# Patient Record
Sex: Female | Born: 1980 | Race: White | Hispanic: No | Marital: Single | Smoking: Current every day smoker
Health system: Southern US, Community
[De-identification: ages and names within clinical notes are randomized; demographics above are authoritative.]

## PROBLEM LIST (undated history)

## (undated) DIAGNOSIS — K529 Noninfective gastroenteritis and colitis, unspecified: Secondary | ICD-10-CM

## (undated) DIAGNOSIS — K5792 Diverticulitis of intestine, part unspecified, without perforation or abscess without bleeding: Secondary | ICD-10-CM

## (undated) DIAGNOSIS — U071 COVID-19: Secondary | ICD-10-CM

## (undated) DIAGNOSIS — T7840XA Allergy, unspecified, initial encounter: Secondary | ICD-10-CM

## (undated) DIAGNOSIS — F32A Depression, unspecified: Secondary | ICD-10-CM

## (undated) DIAGNOSIS — F329 Major depressive disorder, single episode, unspecified: Secondary | ICD-10-CM

## (undated) DIAGNOSIS — F419 Anxiety disorder, unspecified: Secondary | ICD-10-CM

## (undated) DIAGNOSIS — F17200 Nicotine dependence, unspecified, uncomplicated: Secondary | ICD-10-CM

## (undated) HISTORY — DX: Allergy, unspecified, initial encounter: T78.40XA

## (undated) HISTORY — PX: TONSILLECTOMY: SUR1361

## (undated) HISTORY — DX: Nicotine dependence, unspecified, uncomplicated: F17.200

## (undated) HISTORY — DX: Noninfective gastroenteritis and colitis, unspecified: K52.9

## (undated) HISTORY — DX: Diverticulitis of intestine, part unspecified, without perforation or abscess without bleeding: K57.92

## (undated) HISTORY — DX: Anxiety disorder, unspecified: F41.9

---

## 2012-09-16 ENCOUNTER — Emergency Department (INDEPENDENT_AMBULATORY_CARE_PROVIDER_SITE_OTHER)
Admission: EM | Admit: 2012-09-16 | Discharge: 2012-09-16 | Disposition: A | Discharge status: Home / Self Care | Payer: Self-pay | Source: Home / Self Care | Attending: Family Medicine | Admitting: Family Medicine

## 2012-09-16 ENCOUNTER — Encounter: Payer: Self-pay | Admitting: *Deleted

## 2012-09-16 DIAGNOSIS — H6982 Other specified disorders of Eustachian tube, left ear: Secondary | ICD-10-CM

## 2012-09-16 DIAGNOSIS — H6980 Other specified disorders of Eustachian tube, unspecified ear: Secondary | ICD-10-CM

## 2012-09-16 DIAGNOSIS — J069 Acute upper respiratory infection, unspecified: Secondary | ICD-10-CM

## 2012-09-16 DIAGNOSIS — H6990 Unspecified Eustachian tube disorder, unspecified ear: Secondary | ICD-10-CM

## 2012-09-16 MED ORDER — PREDNISONE 20 MG PO TABS: 20.0000 mg | ORAL_TABLET | Freq: Two times a day (BID) | ORAL | Status: DC

## 2012-09-16 MED ORDER — AZITHROMYCIN 250 MG PO TABS: ORAL_TABLET | ORAL | Status: DC

## 2012-09-16 MED ORDER — BENZONATATE 200 MG PO CAPS: 200.0000 mg | ORAL_CAPSULE | Freq: Every day | ORAL | Status: DC

## 2012-09-16 NOTE — ED Provider Notes (Signed)
History    CSN: [id number] Arrival date & time 09/16/12  1633  First MD Initiated Contact with Patient 09/16/12 1648     Chief Complaint  Patient presents with  . Otalgia  . Sinus Problem      HPI Comments: Patient complains of 5 day history of mild sore throat, body aches, sinus congestion, and bilateral earache (ears feel clogged).  She has a productive cough and sensation of tightness in her anterior chest.  No fevers, chills, and sweats.  She continues to smoke.  The history is provided by the patient.   History reviewed. No pertinent past medical history. Past Surgical History  Procedure Laterality Date  . Tonsillectomy     Family History  Problem Relation Age of Onset  . Hypertension Mother   . Diabetes Mother    History  Substance Use Topics  . Smoking status: Current Every Day Smoker -- 15 years    Types: Cigarettes  . Smokeless tobacco: Never Used  . Alcohol Use: No   OB History   Grav Para Term Preterm Abortions TAB SAB Ect Mult Living                 Review of Systems + sore throat + cough No pleuritic pain No wheezing + nasal congestion ? post-nasal drainage + sinus pain/pressure No itchy/red eyes + earache No hemoptysis No SOB No fever, + chills No nausea No vomiting No abdominal pain No diarrhea No urinary symptoms No skin rashes + fatigue + myalgias + headache Used OTC meds without relief  Allergies  Review of patient's allergies indicates no known allergies.  Home Medications   Current Outpatient Rx  Name  Route  Sig  Dispense  Refill  . azithromycin (ZITHROMAX Z-PAK) 250 MG tablet      Take 2 tabs today; then begin one tab once daily for 4 more days. (Rx void after 09/24/12)   6 each   0   . benzonatate (TESSALON) 200 MG capsule   Oral   Take 1 capsule (200 mg total) by mouth at bedtime.   12 capsule   0   . predniSONE (DELTASONE) 20 MG tablet   Oral   Take 1 tablet (20 mg total) by mouth 2 (two) times daily. Take with  food.   10 tablet   0    BP 120/65  Pulse 69  Temp(Src) 98.4 F (36.9 C) (Oral)  Resp 14  Ht 5\' 3"  (1.6 m)  Wt 150 lb (68.04 kg)  BMI 26.58 kg/m2  SpO2 98% Physical Exam Nursing notes and Vital Signs reviewed. Appearance:  Patient appears healthy, stated age, and in no acute distress Eyes:  Pupils are equal, round, and reactive to light and accomodation.  Extraocular movement is intact.  Conjunctivae are not inflamed  Ears:  Canals normal.  Tympanic membranes normal.  Nose:  Mildly congested turbinates.  No sinus tenderness.  Pharynx:  Normal Neck:  Supple.   Tender shotty posterior nodes are palpated bilaterally  Lungs:  Clear to auscultation.  Breath sounds are equal.  Chest:  Distinct tenderness to palpation over the mid-sternum.  Heart:  Regular rate and rhythm without murmurs, rubs, or gallops.  Abdomen:  Nontender without masses or hepatosplenomegaly.  Bowel sounds are present.  No CVA or flank tenderness.  Extremities:  No edema.  No calf tenderness Skin:  No rash present.   ED Course  Procedures  none Labs Reviewed - Tympanogram positive peak pressure left ear; normal right  ear  1. Acute upper respiratory infections of unspecified site; suspect viral URI   2. Eustachian tube dysfunction, left     MDM  There is no evidence of bacterial infection today.   Treat symptomatically for now  Prednisone burst.  Prescription written for Benzonatate (Tessalon) to take at bedtime for night-time cough.  Take Mucinex D (guaifenesin with decongestant) twice daily for congestion.  Increase fluid intake, rest. May use Afrin nasal spray (or generic oxymetazoline) twice daily for about 5 days.  Also recommend using saline nasal spray several times daily and saline nasal irrigation (AYR is a common brand) Stop all antihistamines for now, and other non-prescription cough/cold preparations. May take Ibuprofen 200mg , 4 tabs every 8 hours with food for back and chest/sternum  discomfort. Begin Azithromycin if not improving about one week or if persistent fever develops (Given a prescription to hold, with an expiration date)  Follow-up with family doctor if not improving about10 days.   Lattie Haw, MD 09/16/12 [phone]

## 2012-09-16 NOTE — ED Notes (Signed)
Patient c/o bilateral ear pain, sinus/facial/orbital pain with body aches at times. She was treated for a sinus infection 2 months ago but feels it never fully resolved. Taking IBF daily

## 2012-09-25 ENCOUNTER — Emergency Department
Admission: EM | Admit: 2012-09-25 | Discharge: 2012-09-25 | Disposition: A | Discharge status: Home / Self Care | Payer: Self-pay | Source: Home / Self Care

## 2012-09-25 ENCOUNTER — Encounter: Payer: Self-pay | Admitting: *Deleted

## 2012-09-25 DIAGNOSIS — S335XXA Sprain of ligaments of lumbar spine, initial encounter: Secondary | ICD-10-CM

## 2012-09-25 DIAGNOSIS — S39012A Strain of muscle, fascia and tendon of lower back, initial encounter: Secondary | ICD-10-CM

## 2012-09-25 LAB — POCT URINALYSIS DIP (MANUAL ENTRY)
Bilirubin, UA: NEGATIVE
Glucose, UA: NEGATIVE
Ketones, POC UA: NEGATIVE
Leukocytes, UA: NEGATIVE
Nitrite, UA: NEGATIVE
pH, UA: 5.5 (ref 5–8)

## 2012-09-25 MED ORDER — CEPHALEXIN 500 MG PO CAPS: 500.0000 mg | ORAL_CAPSULE | Freq: Three times a day (TID) | ORAL | Status: DC

## 2012-09-25 MED ORDER — MELOXICAM 15 MG PO TABS: 15.0000 mg | ORAL_TABLET | Freq: Every day | ORAL | Status: DC

## 2012-09-25 MED ORDER — CYCLOBENZAPRINE HCL 10 MG PO TABS: 10.0000 mg | ORAL_TABLET | Freq: Three times a day (TID) | ORAL | Status: DC | PRN

## 2012-09-25 NOTE — ED Notes (Signed)
Joanne Brown c/o flank pain x 2 weeks and pelvic tenderness x 2 days. Used ice/heat, IBF and Goody's without relief.  Denies hematuria.

## 2012-09-25 NOTE — ED Provider Notes (Signed)
History    CSN: [id number] Arrival date & time 09/25/12  1259  None    Chief Complaint  Patient presents with  . Flank Pain    HPI  Patient presented today with chief complaint back pain. Patient states that back pains are present for at least last month. 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 Has noticed bilateral low back pain over 2-4 weeks. Back pain is without radiation into the legs. Pain does seem to be worsened with for flexion as well as prolonged sitting. Patient release her symptoms are related to urinary tract infection although she denies any dysuria, increased urinary frequency, nausea, fever. No bowel or bladder anesthesia. History reviewed. No pertinent past medical history. Past Surgical History  Procedure Laterality Date  . Tonsillectomy     Family History  Problem Relation Age of Onset  . Hypertension Mother   . Diabetes Mother    History  Substance Use Topics  . Smoking status: Current Every Day Smoker -- 15 years    Types: Cigarettes  . Smokeless tobacco: Never Used  . Alcohol Use: No   OB History   Grav Para Term Preterm Abortions TAB SAB Ect Mult Living                 Review of Systems  All other systems reviewed and are negative.    Allergies  Review of patient's allergies indicates no known allergies.  Home Medications   Current Outpatient Rx  Name  Route  Sig  Dispense  Refill  . cephALEXin (KEFLEX) 500 MG capsule   Oral   Take 1 capsule (500 mg total) by mouth 3 (three) times daily.   21 capsule   0   . cyclobenzaprine (FLEXERIL) 10 MG tablet   Oral   Take 1 tablet (10 mg total) by mouth 3 (three) times daily as needed for muscle spasms.   30 tablet   0   . meloxicam (MOBIC) 15 MG tablet   Oral   Take 1 tablet (15 mg total) by mouth daily.   30 tablet   1    BP 119/72  Pulse 78  Temp(Src) 98.1 F (36.7 C) (Oral)  Resp 14  Wt 150 lb (68.04 kg)  BMI 26.58 kg/m2  SpO2 100% Physical Exam    Constitutional: She appears well-developed and well-nourished.  HENT:  Head: Normocephalic and atraumatic.  Eyes: Conjunctivae are normal. Pupils are equal, round, and reactive to light.  Neck: Normal range of motion.  Cardiovascular: Normal rate, regular rhythm and normal heart sounds.   Pulmonary/Chest: Effort normal.  Abdominal: Soft. Bowel sounds are normal.  Positive mild suprapubic tenderness.  Musculoskeletal: Normal range of motion.       Arms: Positive has palpation lumbar spine diffusely. No flank pain or CVA tenderness.  Neurological: She is alert.  Skin: Skin is warm.    ED Course  Procedures (including critical care time) Labs Reviewed  POCT URINALYSIS DIP (MANUAL ENTRY)   No results found. 1. Lumbar strain, initial encounter     MDM  Overall symptoms seem most consistent with a lumbar strain. However, will treat patient for UTI concomitantly has patient does have some suprapubic tenderness. Noted trace blood on urinalysis. Did discuss imaging to rule out kidney stone. Patient declined. Will treat with Mobic, Flexeril, Keflex. Urine culture. Discussed with patient her symptoms fail to improve despite treatment and imaging will likely be needed. Patient expressed understanding of this.     The  patient and/or caregiver has been counseled thoroughly with regard to treatment plan and/or medications prescribed including dosage, schedule, interactions, rationale for use, and possible side effects and they verbalize understanding. Diagnoses and expected course of recovery discussed and will return if not improved as expected or if the condition worsens. Patient and/or caregiver verbalized understanding.       Doree Albee, MD 09/25/12 1349

## 2012-09-29 ENCOUNTER — Telehealth: Payer: Self-pay | Admitting: *Deleted

## 2012-10-02 ENCOUNTER — Telehealth: Payer: Self-pay | Admitting: *Deleted

## 2012-10-02 NOTE — ED Notes (Signed)
Pt called reports that she still has urinary urgency, with some back pain, and cloudy urine. She reports that her abd pain is some better. She would also like a refill on the hydrocodone she received at the ED. Per Dr Cathren Harsh she should take the keflex 500 mg she received from dr newton with new directions of i PO BID x 1 wk, AZO for symptomatic relief, and if she is no better return to clinic or ED for further evaluation. Refill of Hydrocodone denied. Pt notified.

## 2012-10-10 ENCOUNTER — Emergency Department (HOSPITAL_BASED_OUTPATIENT_CLINIC_OR_DEPARTMENT_OTHER): Payer: Medicaid Other

## 2012-10-10 ENCOUNTER — Encounter (HOSPITAL_BASED_OUTPATIENT_CLINIC_OR_DEPARTMENT_OTHER): Payer: Self-pay | Admitting: *Deleted

## 2012-10-10 ENCOUNTER — Emergency Department (HOSPITAL_BASED_OUTPATIENT_CLINIC_OR_DEPARTMENT_OTHER)
Admission: EM | Admit: 2012-10-10 | Discharge: 2012-10-10 | Disposition: A | Discharge status: Home / Self Care | Payer: Medicaid Other | Attending: Emergency Medicine | Admitting: Emergency Medicine

## 2012-10-10 DIAGNOSIS — S335XXA Sprain of ligaments of lumbar spine, initial encounter: Secondary | ICD-10-CM | POA: Insufficient documentation

## 2012-10-10 DIAGNOSIS — F329 Major depressive disorder, single episode, unspecified: Secondary | ICD-10-CM | POA: Insufficient documentation

## 2012-10-10 DIAGNOSIS — S39012A Strain of muscle, fascia and tendon of lower back, initial encounter: Secondary | ICD-10-CM

## 2012-10-10 DIAGNOSIS — X58XXXA Exposure to other specified factors, initial encounter: Secondary | ICD-10-CM | POA: Insufficient documentation

## 2012-10-10 DIAGNOSIS — Y939 Activity, unspecified: Secondary | ICD-10-CM | POA: Insufficient documentation

## 2012-10-10 DIAGNOSIS — F17200 Nicotine dependence, unspecified, uncomplicated: Secondary | ICD-10-CM | POA: Insufficient documentation

## 2012-10-10 DIAGNOSIS — Y929 Unspecified place or not applicable: Secondary | ICD-10-CM | POA: Insufficient documentation

## 2012-10-10 DIAGNOSIS — F3289 Other specified depressive episodes: Secondary | ICD-10-CM | POA: Insufficient documentation

## 2012-10-10 HISTORY — DX: Depression, unspecified: F32.A

## 2012-10-10 HISTORY — DX: Major depressive disorder, single episode, unspecified: F32.9

## 2012-10-10 MED ORDER — HYDROCODONE-ACETAMINOPHEN 5-325 MG PO TABS: 2.0000 | ORAL_TABLET | ORAL | Status: DC | PRN

## 2012-10-10 NOTE — ED Notes (Signed)
States she has had extensive work up with urologist and he states her pain is not urology related and he does not need to see her any longer.

## 2012-10-10 NOTE — ED Provider Notes (Signed)
History    CSN: [id number] Arrival date & time 10/10/12  1153  First MD Initiated Contact with Patient 10/10/12 1201     Chief Complaint  Patient presents with  . Back Pain   (Consider location/radiation/quality/duration/timing/severity/associated sxs/prior Treatment) Patient is a 32 y.o. female presenting with back pain. The history is provided by the patient. No language interpreter was used.  Back Pain Location:  Thoracic spine and lumbar spine Quality:  Aching Radiates to:  Does not radiate Pain severity:  Moderate Pain is:  Worse during the day Onset quality:  Sudden Duration:  4 weeks Timing:  Constant Progression:  Worsening Chronicity:  New Relieved by:  Nothing Ineffective treatments:  None tried Pt has had evaluation for back pain including ua, ct urology consult.   Pt had  Past Medical History  Diagnosis Date  . Depression    Past Surgical History  Procedure Laterality Date  . Tonsillectomy     Family History  Problem Relation Age of Onset  . Hypertension Mother   . Diabetes Mother    History  Substance Use Topics  . Smoking status: Current Every Day Smoker -- 15 years    Types: Cigarettes  . Smokeless tobacco: Never Used  . Alcohol Use: No   OB History   Grav Para Term Preterm Abortions TAB SAB Ect Mult Living                 Review of Systems  Musculoskeletal: Positive for back pain.  All other systems reviewed and are negative.    Allergies  Review of patient's allergies indicates no known allergies.  Home Medications   Current Outpatient Rx  Name  Route  Sig  Dispense  Refill  . Citalopram Hydrobromide (CELEXA PO)   Oral   Take by mouth.         . Oxycodone-Acetaminophen (PERCOCET PO)   Oral   Take by mouth.         . cephALEXin (KEFLEX) 500 MG capsule   Oral   Take 1 capsule (500 mg total) by mouth 3 (three) times daily.   21 capsule   0   . cyclobenzaprine (FLEXERIL) 10 MG tablet   Oral   Take 1 tablet (10 mg  total) by mouth 3 (three) times daily as needed for muscle spasms.   30 tablet   0   . meloxicam (MOBIC) 15 MG tablet   Oral   Take 1 tablet (15 mg total) by mouth daily.   30 tablet   1    BP 110/79  Pulse 97  Temp(Src) 98.7 F (37.1 C) (Oral)  Resp 18  Wt 150 lb (68.04 kg)  BMI 26.58 kg/m2  SpO2 100% Physical Exam  Nursing note and vitals reviewed. Constitutional: She is oriented to person, place, and time. She appears well-developed.  HENT:  Head: Normocephalic.  Right Ear: External ear normal.  Left Ear: External ear normal.  Nose: Nose normal.  Mouth/Throat: Oropharynx is clear and moist.  Eyes: Pupils are equal, round, and reactive to light.  Neck: Normal range of motion. Neck supple.  Cardiovascular: Normal rate.   Pulmonary/Chest: Effort normal and breath sounds normal.  Abdominal: Soft.  Musculoskeletal: Normal range of motion.  Neurological: She is alert and oriented to person, place, and time. She has normal reflexes.  Skin: Skin is warm.  Psychiatric: She has a normal mood and affect.    ED Course  Procedures (including critical care time) Labs Reviewed - No data  to display Dg Lumbar Spine Complete  10/10/2012   *RADIOLOGY REPORT*  Clinical Data: Back pain.  No injury.  LUMBAR SPINE - COMPLETE 4+ VIEW  Comparison:  None.  Findings:  There is no evidence of lumbar spine fracture. Alignment is normal.  Intervertebral disc spaces are maintained. Incidental spina bifida occulta.  IMPRESSION: Negative.   Original Report Authenticated By: Davonna Belling, M.D.   US Transvaginal Non-ob  10/10/2012   *RADIOLOGY REPORT*  Clinical Data: Low back and pelvic pain.  Possible ovarian cyst on recent CT.  No menses since beginning birth control pills 3 months prior to this exam  TRANSABDOMINAL AND TRANSVAGINAL ULTRASOUND OF PELVIS Technique:  Both transabdominal and transvaginal ultrasound examinations of the pelvis were performed. Transabdominal technique was performed for  global imaging of the pelvis including uterus, ovaries, adnexal regions, and pelvic cul-de-sac.  It was necessary to proceed with endovaginal exam following the transabdominal exam to visualize the myometrium, endometrium and adnexa.  Comparison:  CT 10/07/2012  Findings:  Uterus: Is anteverted and anteflexed and demonstrates a sagittal length of 6.6 cm, depth of 3.1 cm and width of 4.5 cm.  A homogeneous myometrium is seen  Endometrium: Appears thin and echogenic with a width of 2.5 mm.  No areas of focal thickening or heterogeneity are noted  Right ovary:  Has a normal appearance measuring 2.6 x 1.1 x 1.8 cm  Left ovary: Has a normal appearance measuring 4.0 x 2.9 x 2.9 cm and contains a collapsing dominant follicle  Other findings: No pelvic fluid or separate adnexal masses are seen.  IMPRESSION: Unremarkable pelvic ultrasound.   Original Report Authenticated By: Rhodia Albright, M.D.   US Pelvis Complete  10/10/2012   *RADIOLOGY REPORT*  Clinical Data: Low back and pelvic pain.  Possible ovarian cyst on recent CT.  No menses since beginning birth control pills 3 months prior to this exam  TRANSABDOMINAL AND TRANSVAGINAL ULTRASOUND OF PELVIS Technique:  Both transabdominal and transvaginal ultrasound examinations of the pelvis were performed. Transabdominal technique was performed for global imaging of the pelvis including uterus, ovaries, adnexal regions, and pelvic cul-de-sac.  It was necessary to proceed with endovaginal exam following the transabdominal exam to visualize the myometrium, endometrium and adnexa.  Comparison:  CT 10/07/2012  Findings:  Uterus: Is anteverted and anteflexed and demonstrates a sagittal length of 6.6 cm, depth of 3.1 cm and width of 4.5 cm.  A homogeneous myometrium is seen  Endometrium: Appears thin and echogenic with a width of 2.5 mm.  No areas of focal thickening or heterogeneity are noted  Right ovary:  Has a normal appearance measuring 2.6 x 1.1 x 1.8 cm  Left ovary: Has a  normal appearance measuring 4.0 x 2.9 x 2.9 cm and contains a collapsing dominant follicle  Other findings: No pelvic fluid or separate adnexal masses are seen.  IMPRESSION: Unremarkable pelvic ultrasound.   Original Report Authenticated By: Rhodia Albright, M.D.   1. Lumbar strain, initial encounter     MDM  Pt counseled on results.    I will treat her pain.   I advised her to see the Orthopaedist for evaluation.   Ovarys are normal.  No cyst.  Elson Areas, PA-C 10/10/12 1507

## 2012-10-10 NOTE — ED Notes (Signed)
Lower back pain and lower abdominal pain for several weeks. States she has had multiple visits to UC, urologist and [facility] and they cannot make the go away. "CT showed ulcers on her ovaries that look normal but could cause pain" per patient. She is getting no pain relief with Percocet. Drove herself here.

## 2012-10-10 NOTE — ED Provider Notes (Signed)
Medical screening examination/treatment/procedure(s) were performed by non-physician practitioner and as supervising physician I was immediately available for consultation/collaboration.   Gilda Crease, MD 10/10/12 [phone]

## 2012-10-13 NOTE — ED Notes (Signed)
Pt presented to ED lobby today requesting to either have an MRI or additional pain medication. Pt sts she does not want to check in. Pt advised that no medication can be prescribed without a physical exam by our ED provider. Pt sts "you guys would only give me enough pain medication to last 2 days anyway". Pt sts she has appt with ortho on July 31.

## 2013-11-02 ENCOUNTER — Telehealth: Payer: Self-pay

## 2013-11-02 NOTE — Telephone Encounter (Signed)
[facility] 17 pages to GI Historical Provider

## 2014-01-14 ENCOUNTER — Ambulatory Visit (INDEPENDENT_AMBULATORY_CARE_PROVIDER_SITE_OTHER): Payer: BC Managed Care – PPO | Admitting: Physician Assistant

## 2014-01-14 VITALS — BP 122/84 | HR 81 | Temp 98.7°F | Resp 16 | Ht 67.0 in | Wt 166.4 lb

## 2014-01-14 DIAGNOSIS — F17200 Nicotine dependence, unspecified, uncomplicated: Secondary | ICD-10-CM

## 2014-01-14 DIAGNOSIS — L02212 Cutaneous abscess of back [any part, except buttock]: Secondary | ICD-10-CM

## 2014-01-14 DIAGNOSIS — T7840XA Allergy, unspecified, initial encounter: Secondary | ICD-10-CM

## 2014-01-14 DIAGNOSIS — F32A Depression, unspecified: Secondary | ICD-10-CM

## 2014-01-14 DIAGNOSIS — F419 Anxiety disorder, unspecified: Secondary | ICD-10-CM | POA: Insufficient documentation

## 2014-01-14 DIAGNOSIS — K5792 Diverticulitis of intestine, part unspecified, without perforation or abscess without bleeding: Secondary | ICD-10-CM | POA: Insufficient documentation

## 2014-01-14 DIAGNOSIS — F329 Major depressive disorder, single episode, unspecified: Secondary | ICD-10-CM

## 2014-01-14 DIAGNOSIS — Z889 Allergy status to unspecified drugs, medicaments and biological substances status: Secondary | ICD-10-CM

## 2014-01-14 MED ORDER — DOXYCYCLINE HYCLATE 100 MG PO CAPS: 100.0000 mg | ORAL_CAPSULE | Freq: Two times a day (BID) | ORAL | Status: AC

## 2014-01-14 NOTE — Progress Notes (Signed)
IDENTIFYING INFORMATION  Joanne Brown / DOB: October 22, 1980 / MRN: [id number]  The patient has Depression; Anxiety; Diverticulitis; Smoking addiction; and Allergy on her problem list.  SUBJECTIVE  Chief Complaint: Otalgia and knot on back   History of present illness: Joanne Brown is a 33 y.o. year old female who presents with a "knot" on her back.  This started 5 days ago, at which point she and her mother "messed" with it and it became aggravated and has grown larger since that time.  She reports some hardness of the lump as well as tenderness.  She denies constitutional symptoms at this time.   She reports that she started having ear pain 5 days ago.   She has tried some Amoxicillin that belonged to her daughter who had some left over from a previous prescription and says it has helped somewhat.  She denies a change in hearing. She reports a history of allergies and has been given fluticasone in the past.  She is amenable to resuming this medication at this time.       She  has a past medical history of Depression; Anxiety; Diverticulitis; Colitis; Smoking addiction; and Allergy..  The patient has a current medication list which includes the following prescription(s): sertraline, citalopram hydrobromide, cyclobenzaprine, hydrocodone-acetaminophen, meloxicam, and oxycodone-acetaminophen..  Joanne Brown has No Known Allergies.. She  reports that she has been smoking Cigarettes.  She has been smoking about 0.00 packs per day for the past 15 years. She has never used smokeless tobacco. She reports that she does not drink alcohol or use illicit drugs. and she  reports that she does not currently engage in sexual activity.  The patient  has past surgical history that includes Tonsillectomy and Tonsillectomy.Marland Kitchen.  Her family history includes Diabetes in her father; Heart disease in her brother and father.  Review of Systems  Constitutional: Negative.   HENT: Negative.   Respiratory:  Negative.   Cardiovascular: Negative.   Musculoskeletal: Negative for back pain and myalgias.  Skin:       Positive for bump on back.    OBJECTIVE  Blood pressure 122/84, pulse 81, temperature 98.7 F (37.1 C), temperature source Oral, resp. rate 16, height 5\' 7"  (1.702 m), weight 166 lb 6.4 oz (75.479 kg), SpO2 99.00%. The patient's body mass index is 26.06 kg/(m^2).  Physical Exam  Vitals reviewed. Constitutional: She is well-developed, well-nourished, and in no distress.  HENT:  Head: Normocephalic.  Right Ear: Hearing, tympanic membrane, external ear and ear canal normal.  Left Ear: Hearing, tympanic membrane, external ear and ear canal normal.  Nose: Nose normal.  Mouth/Throat: Uvula is midline, oropharynx is clear and moist and mucous membranes are normal. No oropharyngeal exudate, posterior oropharyngeal edema, posterior oropharyngeal erythema or tonsillar abscesses.  Eyes: Conjunctivae, EOM and lids are normal. Pupils are equal, round, and reactive to light.  Cardiovascular: Normal rate, regular rhythm and normal heart sounds.   Skin: Skin is warm, dry and intact. She is not diaphoretic.       No results found for this or any previous visit (from the past 24 hour(s)).  ASSESSMENT & PLAN  Abrar was seen today for otalgia and knot on back.  Diagnoses and associated orders for this visit:  Abscess of lower back - doxycycline (VIBRAMYCIN) 100 MG capsule; Take 1 capsule (100 mg total) by mouth 2 (two) times daily. I&D not indicated at this time given the lack of fluctuance. Patient advised to RTC for in ten days  if her symptoms do not abate.    H/O seasonal allergies complicated by otalgia -     Patient has agreed to resume her fluticasone intranasal and to use Afrin OTC for three days only.      The patient was instructed to to call or comeback to clinic as needed, or should symptoms warrant.  Deliah BostonMichael Clark, MHS, PA-C [facility] 01/14/2014 8:58 PM

## 2014-01-14 NOTE — Patient Instructions (Signed)
Apply warm compresses to the affected area frequently and take antibiotic as prescribed.    Abscess An abscess is an infected area that contains a collection of pus and debris.It can occur in almost any part of the body. An abscess is also known as a furuncle or boil. CAUSES  An abscess occurs when tissue gets infected. This can occur from blockage of oil or sweat glands, infection of hair follicles, or a minor injury to the skin. As the body tries to fight the infection, pus collects in the area and creates pressure under the skin. This pressure causes pain. People with weakened immune systems have difficulty fighting infections and get certain abscesses more often.  SYMPTOMS Usually an abscess develops on the skin and becomes a painful mass that is red, warm, and tender. If the abscess forms under the skin, you may feel a moveable soft area under the skin. Some abscesses break open (rupture) on their own, but most will continue to get worse without care. The infection can spread deeper into the body and eventually into the bloodstream, causing you to feel ill.  DIAGNOSIS  Your caregiver will take your medical history and perform a physical exam. A sample of fluid may also be taken from the abscess to determine what is causing your infection. TREATMENT  Your caregiver may prescribe antibiotic medicines to fight the infection. However, taking antibiotics alone usually does not cure an abscess. Your caregiver may need to make a small cut (incision) in the abscess to drain the pus. In some cases, gauze is packed into the abscess to reduce pain and to continue draining the area. HOME CARE INSTRUCTIONS   Only take over-the-counter or prescription medicines for pain, discomfort, or fever as directed by your caregiver.  If you were prescribed antibiotics, take them as directed. Finish them even if you start to feel better.  If gauze is used, follow your caregiver's directions for changing the  gauze.  To avoid spreading the infection:  Keep your draining abscess covered with a bandage.  Wash your hands well.  Do not share personal care items, towels, or whirlpools with others.  Avoid skin contact with others.  Keep your skin and clothes clean around the abscess.  Keep all follow-up appointments as directed by your caregiver. SEEK MEDICAL CARE IF:   You have increased pain, swelling, redness, fluid drainage, or bleeding.  You have muscle aches, chills, or a general ill feeling.  You have a fever. MAKE SURE YOU:   Understand these instructions.  Will watch your condition.  Will get help right away if you are not doing well or get worse. Document Released: 12/20/2004 Document Revised: 09/11/2011 Document Reviewed: 05/25/2011 [facility] Patient Information 2015 [employer] [place]. This information is not intended to replace advice given to you by your health care provider. Make sure you discuss any questions you have with your health care provider.

## 2014-01-15 NOTE — Progress Notes (Signed)
I was directly involved with the patient's care and agree with the physical, diagnosis and treatment plan.  

## 2014-02-01 ENCOUNTER — Telehealth: Payer: Self-pay

## 2014-02-01 ENCOUNTER — Ambulatory Visit (INDEPENDENT_AMBULATORY_CARE_PROVIDER_SITE_OTHER): Payer: BC Managed Care – PPO | Admitting: Emergency Medicine

## 2014-02-01 ENCOUNTER — Ambulatory Visit (HOSPITAL_COMMUNITY)
Admission: RE | Admit: 2014-02-01 | Discharge: 2014-02-01 | Disposition: A | Discharge status: Home / Self Care | Payer: BC Managed Care – PPO | Source: Ambulatory Visit | Attending: Emergency Medicine | Admitting: Emergency Medicine

## 2014-02-01 VITALS — BP 110/70 | HR 87 | Temp 99.1°F | Resp 16 | Ht 63.0 in | Wt 165.0 lb

## 2014-02-01 DIAGNOSIS — R1011 Right upper quadrant pain: Secondary | ICD-10-CM

## 2014-02-01 DIAGNOSIS — R1031 Right lower quadrant pain: Secondary | ICD-10-CM

## 2014-02-01 LAB — POCT CBC
Granulocyte percent: 78.7 %G (ref 37–80)
HCT, POC: 43.6 % (ref 37.7–47.9)
HEMOGLOBIN: 14.2 g/dL (ref 12.2–16.2)
Lymph, poc: 2.1 (ref 0.6–3.4)
MCH, POC: 29.2 pg (ref 27–31.2)
MCHC: 32.6 g/dL (ref 31.8–35.4)
MCV: 89.4 fL (ref 80–97)
MID (cbc): 1.1 — AB (ref 0–0.9)
MPV: 7.9 fL (ref 0–99.8)
PLATELET COUNT, POC: 337 10*3/uL (ref 142–424)
POC GRANULOCYTE: 11.9 — AB (ref 2–6.9)
POC LYMPH PERCENT: 14.2 %L (ref 10–50)
POC MID %: 7.1 % (ref 0–12)
RBC: 4.88 M/uL (ref 4.04–5.48)
RDW, POC: 13.5 %
WBC: 15.1 10*3/uL — AB (ref 4.6–10.2)

## 2014-02-01 LAB — POCT UA - MICROSCOPIC ONLY
BACTERIA, U MICROSCOPIC: NEGATIVE
CASTS, UR, LPF, POC: NEGATIVE
Crystals, Ur, HPF, POC: NEGATIVE
Mucus, UA: NEGATIVE
RBC, urine, microscopic: NEGATIVE
WBC, Ur, HPF, POC: NEGATIVE
Yeast, UA: NEGATIVE

## 2014-02-01 LAB — POCT URINALYSIS DIPSTICK
BILIRUBIN UA: NEGATIVE
GLUCOSE UA: NEGATIVE
Ketones, UA: NEGATIVE
LEUKOCYTES UA: NEGATIVE
NITRITE UA: NEGATIVE
PH UA: 6
Protein, UA: NEGATIVE
Spec Grav, UA: 1.01
UROBILINOGEN UA: 0.2

## 2014-02-01 NOTE — Patient Instructions (Signed)
[facility] then to admitting to register for CT scan

## 2014-02-01 NOTE — Telephone Encounter (Signed)
Pt was seen this morning and was sent for a CT scan. Her scan was negative and Dr. Dareen PianoAnderson instructed her to continue with plan per pt. Chelle called Dr. Dareen PianoAnderson to see what the plan was and he had not been advised of the neg CT. Per Dr. Dareen PianoAnderson, get a copy of the CT scan to see double check that it is completely negative.  Received CT scan-sent to be scanned-completely negative Per Dr. Dareen PianoAnderson, Follow-up with us or GI (preferrable GI) and take OTC Immodium for diarrhea. Pt has already been taking Immodium and is not having very much diarrhea anymore, just abd pain. Advised rest and fluids and if it gets worse to come here or to the ER

## 2014-02-01 NOTE — Progress Notes (Signed)
[facility] [address], [place] [id number] [phone]  Date:  02/01/2014   Name:  Joanne Brown   DOB:  10-24-1980   MRN:  [id number]  PCP:  No PCP Per Patient    Chief Complaint: Abdominal Pain and Diarrhea   History of Present Illness:  Joanne Brown is a 33 y.o. very pleasant female patient who presents with the following:  Patient gives a history of diverticulitis (right side) in April this year.  It was not treated surgically Later in summer she underwent a colonoscopy and was found to have mild colitis again not treated. Over the weekend developed watery diarrhea.  20 stools at lease.  The patient has no complaint of blood, mucous, or pus in her stools. She had no measured fever but felt hot.  No chills. No dysuria, urgency or frequency.  No GYN symptom No ill contacts Recently stopped her "stress medications" and is very teary Is concerned she has another abscess as she feels "bad".\ No improvement with over the counter medications or other home remedies.  Denies other complaint or health concern today.   Patient Active Problem List   Diagnosis Date Noted  . Depression   . Anxiety   . Diverticulitis   . Smoking addiction   . Allergy     Past Medical History  Diagnosis Date  . Depression   . Anxiety   . Diverticulitis   . Colitis   . Smoking addiction   . Allergy     Past Surgical History  Procedure Laterality Date  . Tonsillectomy    . Tonsillectomy      History  Substance Use Topics  . Smoking status: Current Every Day Smoker -- 15 years    Types: Cigarettes  . Smokeless tobacco: Never Used  . Alcohol Use: No    Family History  Problem Relation Age of Onset  . Diabetes Father   . Heart disease Father   . Heart disease Brother     No Known Allergies  Medication list has been reviewed and updated.  Current Outpatient Prescriptions on File Prior to Visit  Medication Sig Dispense Refill  .  Citalopram Hydrobromide (CELEXA PO) Take by mouth.    . cyclobenzaprine (FLEXERIL) 10 MG tablet Take 1 tablet (10 mg total) by mouth 3 (three) times daily as needed for muscle spasms. 30 tablet 0  . HYDROcodone-acetaminophen (NORCO/VICODIN) 5-325 MG per tablet Take 2 tablets by mouth every 4 (four) hours as needed. 20 tablet 0  . meloxicam (MOBIC) 15 MG tablet Take 1 tablet (15 mg total) by mouth daily. 30 tablet 1  . Oxycodone-Acetaminophen (PERCOCET PO) Take by mouth.    . sertraline (ZOLOFT) 50 MG tablet Take 50 mg by mouth daily.     No current facility-administered medications on file prior to visit.    Review of Systems:  As per HPI, otherwise negative.    Physical Examination: Filed Vitals:   02/01/14 0901  BP: 110/70  Pulse: 87  Temp: 99.1 F (37.3 C)  Resp: 16   Filed Vitals:   02/01/14 0901  Height: 5\' 3"  (1.6 m)  Weight: 165 lb (74.844 kg)   Body mass index is 29.24 kg/(m^2). Ideal Body Weight: Weight in (lb) to have BMI = 25: 140.8  GEN: WDWN, NAD, Non-toxic, A & O x 3 HEENT: Atraumatic, Normocephalic. Neck supple. No masses, No LAD. Ears and Nose: No external deformity. CV: RRR, No M/G/R. No JVD. No thrill.  No extra heart sounds. PULM: CTA B, no wheezes, crackles, rhonchi. No retractions. No resp. distress. No accessory muscle use. ABD: S, right sided tenderness , ND, +BS. No rebound. No HSM. EXTR: No c/c/e NEURO Normal gait.  PSYCH: Normally interactive. Conversant. Not depressed or anxious appearing.  Calm demeanor.    Assessment and Plan: Acute surgical abdomen Likely appendicitis  Signed,  Phillips OdorJeffery Coleman Kalas, MD   Results for orders placed or performed in visit on 02/01/14  POCT CBC  Result Value Ref Range   WBC 15.1 (A) 4.6 - 10.2 K/uL   Lymph, poc 2.1 0.6 - 3.4   POC LYMPH PERCENT 14.2 10 - 50 %L   MID (cbc) 1.1 (A) 0 - 0.9   POC MID % 7.1 0 - 12 %M   POC Granulocyte 11.9 (A) 2 - 6.9   Granulocyte percent 78.7 37 - 80 %G   RBC 4.88 4.04 -  5.48 M/uL   Hemoglobin 14.2 12.2 - 16.2 g/dL   HCT, POC 16.143.6 09.637.7 - 47.9 %   MCV 89.4 80 - 97 fL   MCH, POC 29.2 27 - 31.2 pg   MCHC 32.6 31.8 - 35.4 g/dL   RDW, POC 04.513.5 %   Platelet Count, POC 337 142 - 424 K/uL   MPV 7.9 0 - 99.8 fL  POCT urinalysis dipstick  Result Value Ref Range   Color, UA yellow    Clarity, UA clear    Glucose, UA neg    Bilirubin, UA neg    Ketones, UA neg    Spec Grav, UA 1.010    Blood, UA trace-lysed    pH, UA 6.0    Protein, UA neg    Urobilinogen, UA 0.2    Nitrite, UA neg    Leukocytes, UA Negative   POCT UA - Microscopic Only  Result Value Ref Range   WBC, Ur, HPF, POC neg    RBC, urine, microscopic neg    Bacteria, U Microscopic neg    Mucus, UA neg    Epithelial cells, urine per micros 0-3    Crystals, Ur, HPF, POC neg    Casts, Ur, LPF, POC neg    Yeast, UA neg    Patient clearly has an acute abdomen and refused to have scan done in [facility].  [facility] I explained advantages inherent in [facility]; they are ready and we can promptly obtain a report after which she can go  Wherever she wants to go.  There are also scheduling difficulties for us with outside places She refused the scan and left the hospital

## 2014-02-02 ENCOUNTER — Ambulatory Visit (INDEPENDENT_AMBULATORY_CARE_PROVIDER_SITE_OTHER): Payer: BC Managed Care – PPO | Admitting: Family Medicine

## 2014-02-02 VITALS — BP 112/72 | HR 106 | Temp 98.2°F | Resp 16 | Ht 63.0 in | Wt 168.0 lb

## 2014-02-02 DIAGNOSIS — R1084 Generalized abdominal pain: Secondary | ICD-10-CM

## 2014-02-02 DIAGNOSIS — Z8719 Personal history of other diseases of the digestive system: Secondary | ICD-10-CM

## 2014-02-02 DIAGNOSIS — R41840 Attention and concentration deficit: Secondary | ICD-10-CM

## 2014-02-02 MED ORDER — CIPROFLOXACIN HCL 500 MG PO TABS: 500.0000 mg | ORAL_TABLET | Freq: Two times a day (BID) | ORAL | Status: AC

## 2014-02-02 MED ORDER — METRONIDAZOLE 500 MG PO TABS: 500.0000 mg | ORAL_TABLET | Freq: Two times a day (BID) | ORAL | Status: AC

## 2014-02-02 NOTE — Patient Instructions (Signed)
Ciprofloxacin tablets  What is this medicine?  CIPROFLOXACIN (sip roe FLOX a sin) is a quinolone antibiotic. It is used to treat certain kinds of bacterial infections. It will not work for colds, flu, or other viral infections.  This medicine may be used for other purposes; ask your health care provider or pharmacist if you have questions.  COMMON BRAND NAME(S): Cipro  What should I tell my health care provider before I take this medicine?  They need to know if you have any of these conditions:  -bone problems  -cerebral disease  -joint problems  -irregular heartbeat  -kidney disease  -liver disease  -myasthenia gravis  -seizure disorder  -tendon problems  -an unusual or allergic reaction to ciprofloxacin, other antibiotics or medicines, foods, dyes, or preservatives  -pregnant or trying to get pregnant  -breast-feeding  How should I use this medicine?  Take this medicine by mouth with a glass of water. Follow the directions on the prescription label. Take your medicine at regular intervals. Do not take your medicine more often than directed. Take all of your medicine as directed even if you think your are better. Do not skip doses or stop your medicine early.  You can take this medicine with food or on an empty stomach. It can be taken with a meal that contains dairy or calcium, but do not take it alone with a dairy product, like milk or yogurt or calcium-fortified juice.  A special MedGuide will be given to you by the pharmacist with each prescription and refill. Be sure to read this information carefully each time.  Talk to your pediatrician regarding the use of this medicine in children. Special care may be needed.  Overdosage: If you think you have taken too much of this medicine contact a poison control center or emergency room at once.  NOTE: This medicine is only for you. Do not share this medicine with others.  What if I miss a dose?  If you miss a dose, take it as soon as you can. If it is almost time for  your next dose, take only that dose. Do not take double or extra doses.  What may interact with this medicine?  Do not take this medicine with any of the following medications:  -cisapride  -droperidol  -terfenadine  -tizanidine  This medicine may also interact with the following medications:  -antacids  -birth control pills  -caffeine  -cyclosporin  -didanosine (ddI) buffered tablets or powder  -medicines for diabetes  -medicines for inflammation like ibuprofen, naproxen  -methotrexate  -multivitamins  -omeprazole  -phenytoin  -probenecid  -sucralfate  -theophylline  -warfarin  This list may not describe all possible interactions. Give your health care provider a list of all the medicines, herbs, non-prescription drugs, or dietary supplements you use. Also tell them if you smoke, drink alcohol, or use illegal drugs. Some items may interact with your medicine.  What should I watch for while using this medicine?  Tell your doctor or health care professional if your symptoms do not improve.  Do not treat diarrhea with over the counter products. Contact your doctor if you have diarrhea that lasts more than 2 days or if it is severe and watery.  You may get drowsy or dizzy. Do not drive, use machinery, or do anything that needs mental alertness until you know how this medicine affects you. Do not stand or sit up quickly, especially if you are an older patient. This reduces the risk of dizzy or   fainting spells.  This medicine can make you more sensitive to the sun. Keep out of the sun. If you cannot avoid being in the sun, wear protective clothing and use sunscreen. Do not use sun lamps or tanning beds/booths.  Avoid antacids, aluminum, calcium, iron, magnesium, and zinc products for 6 hours before and 2 hours after taking a dose of this medicine.  What side effects may I notice from receiving this medicine?  Side effects that you should report to your doctor or health care professional as soon as possible:  -  allergic  reactions like skin rash, itching or hives, swelling of the face, lips, or tongue  -  breathing problems  -  confusion, nightmares or hallucinations  -  feeling faint or lightheaded, falls  -  irregular heartbeat  -  joint, muscle or tendon pain or swelling  -  pain or trouble passing urine  -persistent headache with or without blurred vision  -  redness, blistering, peeling or loosening of the skin, including inside the mouth  -  seizure  -  unusual pain, numbness, tingling, or weakness  Side effects that usually do not require medical attention (report to your doctor or health care professional if they continue or are bothersome):  -  diarrhea  -  nausea or stomach upset  -  white patches or sores in the mouth  This list may not describe all possible side effects. Call your doctor for medical advice about side effects. You may report side effects to FDA at 1-800-FDA-1088.  Where should I keep my medicine?  Keep out of the reach of children.  Store at room temperature below 30 degrees C (86 degrees F). Keep container tightly closed. Throw away any unused medicine after the expiration date.  NOTE: This sheet is a summary. It may not cover all possible information. If you have questions about this medicine, talk to your doctor, pharmacist, or health care provider.   2015, Elsevier/Gold Standard. (2012-10-16 16:10:46)  Metronidazole tablets or capsules  What is this medicine?  METRONIDAZOLE (me troe NI da zole) is an antiinfective. It is used to treat certain kinds of bacterial and protozoal infections. It will not work for colds, flu, or other viral infections.  This medicine may be used for other purposes; ask your health care provider or pharmacist if you have questions.  COMMON BRAND NAME(S): Flagyl  What should I tell my health care provider before I take this medicine?  They need to know if you have any of these conditions:  -anemia or other blood disorders  -disease of the nervous system  -fungal or yeast  infection  -if you drink alcohol containing drinks  -liver disease  -seizures  -an unusual or allergic reaction to metronidazole, or other medicines, foods, dyes, or preservatives  -pregnant or trying to get pregnant  -breast-feeding  How should I use this medicine?  Take this medicine by mouth with a full glass of water. Follow the directions on the prescription label. Take your medicine at regular intervals. Do not take your medicine more often than directed. Take all of your medicine as directed even if you think you are better. Do not skip doses or stop your medicine early.  Talk to your pediatrician regarding the use of this medicine in children. Special care may be needed.  Overdosage: If you think you have taken too much of this medicine contact a poison control center or emergency room at once.  NOTE: This medicine is only   for you. Do not share this medicine with others.  What if I miss a dose?  If you miss a dose, take it as soon as you can. If it is almost time for your next dose, take only that dose. Do not take double or extra doses.  What may interact with this medicine?  Do not take this medicine with any of the following medications:  -alcohol or any product that contains alcohol  -amprenavir oral solution  -cisapride  -disulfiram  -dofetilide  -dronedarone  -paclitaxel injection  -pimozide  -ritonavir oral solution  -sertraline oral solution  -sulfamethoxazole-trimethoprim injection  -thioridazine  -ziprasidone  This medicine may also interact with the following medications:  -birth control pills  -cimetidine  -lithium  -other medicines that prolong the QT interval (cause an abnormal heart rhythm)  -phenobarbital  -phenytoin  -warfarin  This list may not describe all possible interactions. Give your health care provider a list of all the medicines, herbs, non-prescription drugs, or dietary supplements you use. Also tell them if you smoke, drink alcohol, or use illegal drugs. Some items may interact  with your medicine.  What should I watch for while using this medicine?  Tell your doctor or health care professional if your symptoms do not improve or if they get worse.  You may get drowsy or dizzy. Do not drive, use machinery, or do anything that needs mental alertness until you know how this medicine affects you. Do not stand or sit up quickly, especially if you are an older patient. This reduces the risk of dizzy or fainting spells.  Avoid alcoholic drinks while you are taking this medicine and for three days afterward. Alcohol may make you feel dizzy, sick, or flushed.  If you are being treated for a sexually transmitted disease, avoid sexual contact until you have finished your treatment. Your sexual partner may also need treatment.  What side effects may I notice from receiving this medicine?  Side effects that you should report to your doctor or health care professional as soon as possible:  -allergic reactions like skin rash or hives, swelling of the face, lips, or tongue  -confusion, clumsiness  -difficulty speaking  -discolored or sore mouth  -dizziness  -fever, infection  -numbness, tingling, pain or weakness in the hands or feet  -trouble passing urine or change in the amount of urine  -redness, blistering, peeling or loosening of the skin, including inside the mouth  -seizures  -unusually weak or tired  -vaginal irritation, dryness, or discharge  Side effects that usually do not require medical attention (report to your doctor or health care professional if they continue or are bothersome):  -diarrhea  -headache  -irritability  -metallic taste  -nausea  -stomach pain or cramps  -trouble sleeping  This list may not describe all possible side effects. Call your doctor for medical advice about side effects. You may report side effects to FDA at 1-800-FDA-1088.  Where should I keep my medicine?  Keep out of the reach of children.  Store at room temperature below 25 degrees C (77 degrees F). Protect from  light. Keep container tightly closed. Throw away any unused medicine after the expiration date.  NOTE: This sheet is a summary. It may not cover all possible information. If you have questions about this medicine, talk to your doctor, pharmacist, or health care provider.   2015, Elsevier/Gold Standard. (2012-10-17 14:08:39)  Diverticulitis  Diverticulitis is inflammation or infection of small pouches in your colon that form when   you have a condition called diverticulosis. The pouches in your colon are called diverticula. Your colon, or large intestine, is where water is absorbed and stool is formed.  Complications of diverticulitis can include:   Bleeding.   Severe infection.   Severe pain.   Perforation of your colon.   Obstruction of your colon.  CAUSES   Diverticulitis is caused by bacteria.  Diverticulitis happens when stool becomes trapped in diverticula. This allows bacteria to grow in the diverticula, which can lead to inflammation and infection.  RISK FACTORS  People with diverticulosis are at risk for diverticulitis. Eating a diet that does not include enough fiber from fruits and vegetables may make diverticulitis more likely to develop.  SYMPTOMS   Symptoms of diverticulitis may include:   Abdominal pain and tenderness. The pain is normally located on the left side of the abdomen, but may occur in other areas.   Fever and chills.   Bloating.   Cramping.   Nausea.   Vomiting.   Constipation.   Diarrhea.   Blood in your stool.  DIAGNOSIS   Your health care provider will ask you about your medical history and do a physical exam. You may need to have tests done because many medical conditions can cause the same symptoms as diverticulitis. Tests may include:   Blood tests.   Urine tests.   Imaging tests of the abdomen, including X-rays and CT scans.  When your condition is under control, your health care provider may recommend that you have a colonoscopy. A colonoscopy can show how severe  your diverticula are and whether something else is causing your symptoms.  TREATMENT   Most cases of diverticulitis are mild and can be treated at home. Treatment may include:   Taking over-the-counter pain medicines.   Following a clear liquid diet.   Taking antibiotic medicines by mouth for 7-10 days.  More severe cases may be treated at a hospital. Treatment may include:   Not eating or drinking.   Taking prescription pain medicine.   Receiving antibiotic medicines through an IV tube.   Receiving fluids and nutrition through an IV tube.   Surgery.  HOME CARE INSTRUCTIONS    Follow your health care provider's instructions carefully.   Follow a full liquid diet or other diet as directed by your health care provider. After your symptoms improve, your health care provider may tell you to change your diet. He or she may recommend you eat a high-fiber diet. Fruits and vegetables are good sources of fiber. Fiber makes it easier to pass stool.   Take fiber supplements or probiotics as directed by your health care provider.   Only take medicines as directed by your health care provider.   Keep all your follow-up appointments.  SEEK MEDICAL CARE IF:    Your pain does not improve.   You have a hard time eating food.   Your bowel movements do not return to normal.  SEEK IMMEDIATE MEDICAL CARE IF:    Your pain becomes worse.   Your symptoms do not get better.   Your symptoms suddenly get worse.   You have a fever.   You have repeated vomiting.   You have bloody or black, tarry stools.  MAKE SURE YOU:    Understand these instructions.   Will watch your condition.   Will get help right away if you are not doing well or get worse.  Document Released: 12/20/2004 Document Revised: 03/17/2013 Document Reviewed: 02/04/2013  [employer] Patient   Information 2015 [employer]. This information is not intended to replace advice given to you by your health care provider. Make sure you discuss any questions you  have with your health care provider.

## 2014-02-02 NOTE — Progress Notes (Signed)
 Chief Complaint:  Chief Complaint  Patient presents with  . Anxiety    HPI: Joanne Brown is a 33 y.o. 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 Works in Recruitment consultant now.  She was on anxiety medicine and tried taking different dosages to help with her anxiety and lack of focus, but felt like it was making her too lazy She took celexa 20 mg daily adn then went up to 40 mg then went back to 20 , dose changes did not make a difference. She denies being depressed, she in fact feesl the opposite. She is not manic She has weaned herself off the celexa and that was several weeks ago, she states it did not make a difference how she feels.  Early this year she was put Zoloft 50 mg and then took it 75  Due to pain in her abd and to see if that helped, it di dnot.  She has diverticulitis and they found out because she ahd a perforated colon in April 2015 She was seen by the phsyrician adn then GI doctor in [place], CT scan and repeat colonscopies always show  Inflammation ie colitis but not crohns and not UC.  BX of colonscopy July 2015 showed inflammation/colitis She states that no one has told her she crohsn or UC or irritable bowel syndrome Currently no diarrhea, dyrusia, fevers chills, nausea vomiting. She has diffuse abd pain, she had a wbc on her last visit, her CT scan per patient did not show anything acute, just infalmmation She was not given anything for it No histoyr of STI, she doe snot get cramps with menses. UTD on pap.   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 This is the opposite of what she would expect. She is majoring in Audiological scientist.  No one in her family has ADD/ADHD 3 years ago she was managing things better, she dd not take time to relax,at his time she feels like she is not completing her tas even though everything is easier for herk. Not focus on school work Not focus on her chores Not focus on completing tasks at work She states she tried relaxing but it makes her feel really slow and does not make her get the work done any faster    Past Medical History  Diagnosis Date  . Depression   . Anxiety   . Diverticulitis   . Colitis   . Smoking addiction   . Allergy    Past Surgical History  Procedure Laterality Date  . Tonsillectomy    . Tonsillectomy     History   Social  History  . Marital Status: Single    Spouse Name: N/A    Number of Children: N/A  . Years of Education: N/A   Social History Main Topics  . Smoking status: Current Every Day Smoker -- 15 years    Types: Cigarettes  . Smokeless tobacco: Never Used  . Alcohol Use: No  . Drug Use: No  . Sexual Activity: Not Currently   Other Topics Concern  . None   Social History Narrative   Family History  Problem Relation Age of Onset  . Diabetes Father   . Heart disease Father   . Heart disease Brother    No Known Allergies Prior to Admission medications   Medication Sig Start Date End Date Taking? Authorizing Provider  VALACYCLOVIR HCL PO Take by mouth.   Yes Historical Provider, MD     ROS: The patient denies fevers, chills, night sweats, unintentional weight loss, chest pain, palpitations, wheezing, dyspnea on exertion, nausea, vomiting, dysuria,  hematuria, melena, numbness, weakness, or tingling.   All other systems have been reviewed and were otherwise negative with the exception of those mentioned in the HPI and as above.    PHYSICAL EXAM: Filed Vitals:   02/02/14 1719  BP: 112/72  Pulse: 106  Temp: 98.2 F (36.8 C)  Resp: 16   Filed Vitals:   02/02/14 1719  Height: 5\' 3"  (1.6 m)  Weight: 168 lb (76.204 kg)   Body mass index is 29.77 kg/(m^2).  General: Alert, minimally distressed, tearful HEENT:  Normocephalic, atraumatic, oropharynx patent. EOMI, PERRLA Cardiovascular:  Regular rate and rhythm, no rubs murmurs or gallops.  No Carotid bruits, radial pulse intact. No pedal edema.  Respiratory: Clear to auscultation bilaterally.  No wheezes, rales, or rhonchi.  No cyanosis, no use of accessory musculature GI: No organomegaly, abdomen is soft and diff minimal tenderness, positive bowel sounds.  No masses. Skin: No rashes. Neurologic: Facial musculature symmetric. Psychiatric: Patient is appropriate throughout our interaction. Lymphatic: No cervical lymphadenopathy Musculoskeletal: Gait intact.   LABS: Results for orders placed or performed in visit on 02/01/14  POCT CBC  Result Value Ref Range   WBC 15.1 (A) 4.6 - 10.2 K/uL   Lymph, poc 2.1 0.6 - 3.4   POC LYMPH PERCENT 14.2 10 - 50 %L   MID (cbc) 1.1 (A) 0 - 0.9   POC MID % 7.1 0 - 12 %M   POC Granulocyte 11.9 (A) 2 - 6.9   Granulocyte percent 78.7 37 - 80 %G   RBC 4.88 4.04 - 5.48 M/uL   Hemoglobin 14.2 12.2 - 16.2 g/dL   HCT, POC 19.143.6 47.837.7 - 47.9 %   MCV 89.4 80 - 97 fL   MCH, POC 29.2 27 - 31.2 pg   MCHC 32.6 31.8 - 35.4 g/dL   RDW, POC 29.513.5 %   Platelet Count, POC 337 142 - 424 K/uL   MPV 7.9 0 - 99.8 fL  POCT urinalysis dipstick  Result Value Ref Range   Color, UA yellow    Clarity, UA clear    Glucose, UA neg    Bilirubin, UA neg    Ketones, UA neg    Spec Grav, UA 1.010    Blood, UA trace-lysed    pH, UA 6.0    Protein, UA neg     Urobilinogen, UA 0.2    Nitrite, UA neg    Leukocytes, UA Negative   POCT UA - Microscopic Only  Result Value Ref Range   WBC,  Ur, HPF, POC neg    RBC, urine, microscopic neg    Bacteria, U Microscopic neg    Mucus, UA neg    Epithelial cells, urine per micros 0-3    Crystals, Ur, HPF, POC neg    Casts, Ur, LPF, POC neg    Yeast, UA neg      EKG/XRAY:   Primary [facility]   ASSESSMENT/PLAN: Encounter Diagnoses  Name Primary?  . Generalized abdominal pain   . History of diverticulitis Yes  . Lack of concentration    Ms Malen GauzeBlankenship is here to see if she has been misdiagnosed with anxiety and also to talk about her acute on chronic abd pain and to establish care.  She has had a hx of a perforated colon without surgery due to diverticulitis in April 2015 per patient , was seen by [facility], and since her hospitalization for her perforated colon her stomach has never been the same. Again she has never had abd surgery for a perforated colon. She states they treated her with anitbiotics and pain meds.  She wants to be seen by someone closer for her GI issues and she wants to know if the GI issues are realted to her nerves or if she has been misdiagnosed with anxiety and in reality it is really  [facility] refer to [facility]( Michie Dew)  And /or [facility] for ADD testing She states that she went to get [facility]01/14 and all it showed was inflammation and nothing acute but she is still having pain issues  Will treat accordning if need once she gets tested for ADD, would start with nonstimulant first  Get records for GI and HD . She has legitimate leukocytosis but I am sure if she had a perforated colon due to diverticulitis  since there was not any surgical intervention. Will ask her to get records for us to review   Gross sideeffects, risk and benefits, and alternatives of medications d/w patient. Patient is aware  that all medications have potential sideeffects and we are unable to predict every sideeffect or drug-drug interaction that may occur.  ,  PHUONG, DO 02/02/2014 8:09 PM

## 2014-03-22 ENCOUNTER — Ambulatory Visit: Payer: BC Managed Care – PPO

## 2015-01-21 IMAGING — US US PELVIS COMPLETE
1 series · 13 of 25 positions shown · non-contrast
Comparison: CT 10/07/2012

CLINICAL DATA: Low back and pelvic pain.  Possible ovarian cyst on
recent CT.  No menses since beginning birth control pills 3 months
prior to this exam



[Series 1: us pelvis complete · 0.24mm/px · 13 of 50 slices shown]
[im 1/50]
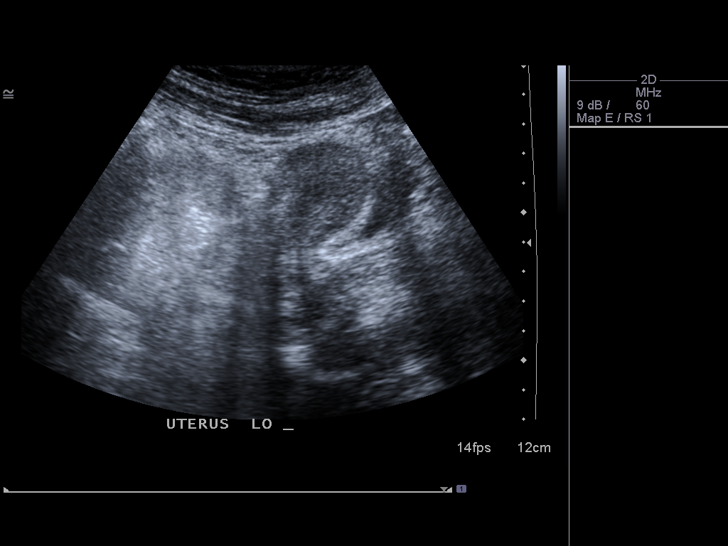
[im 5/50]
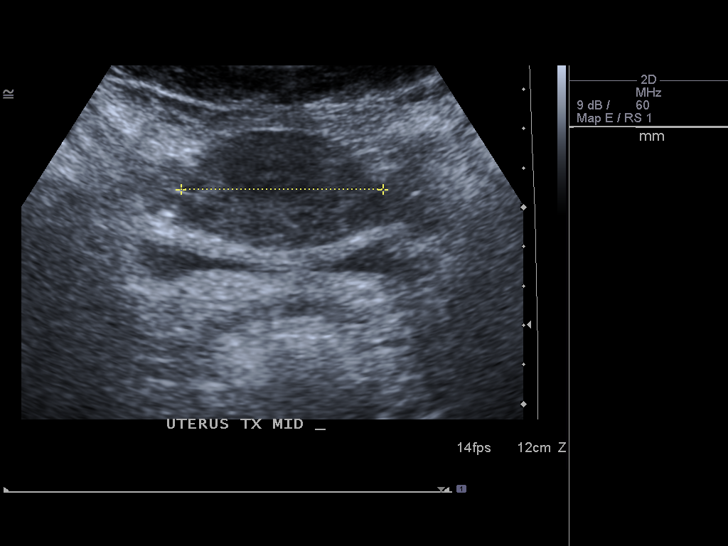
[im 9/50]
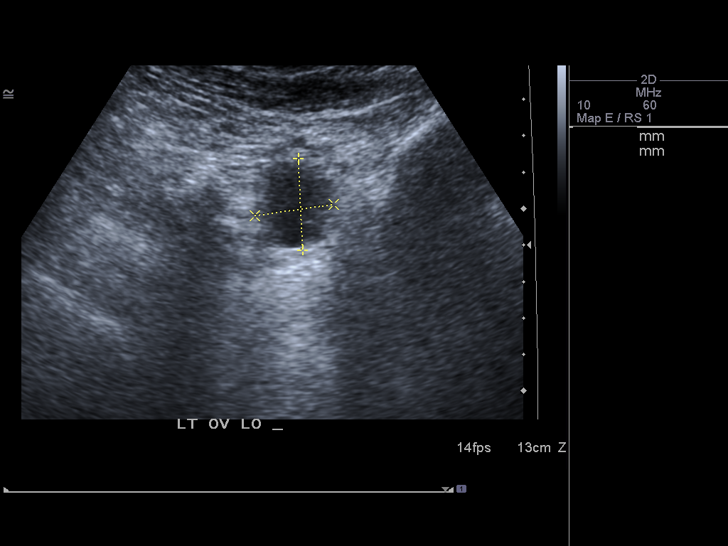
[im 13/50]
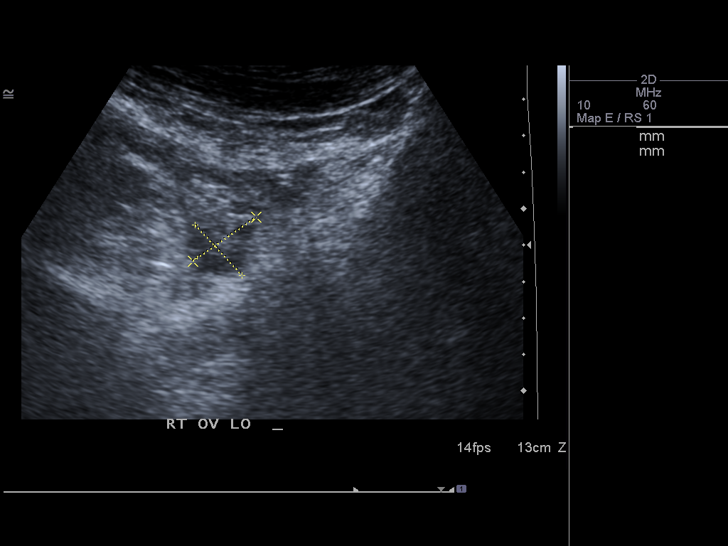
[im 17/50]
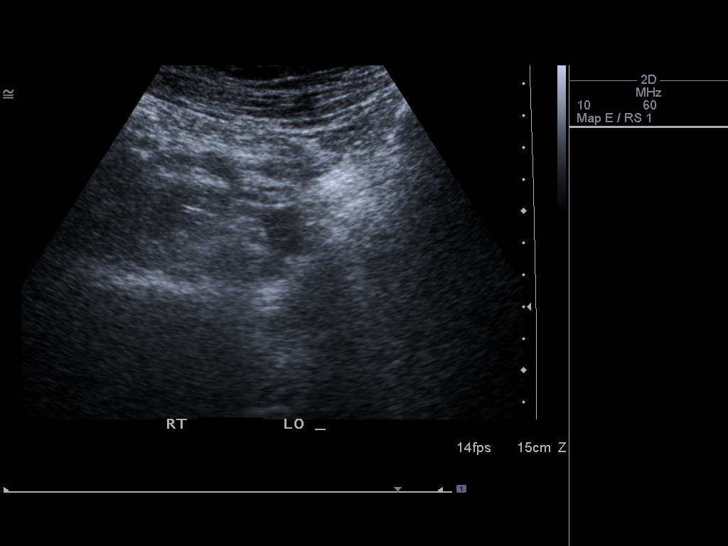
[im 21/50]
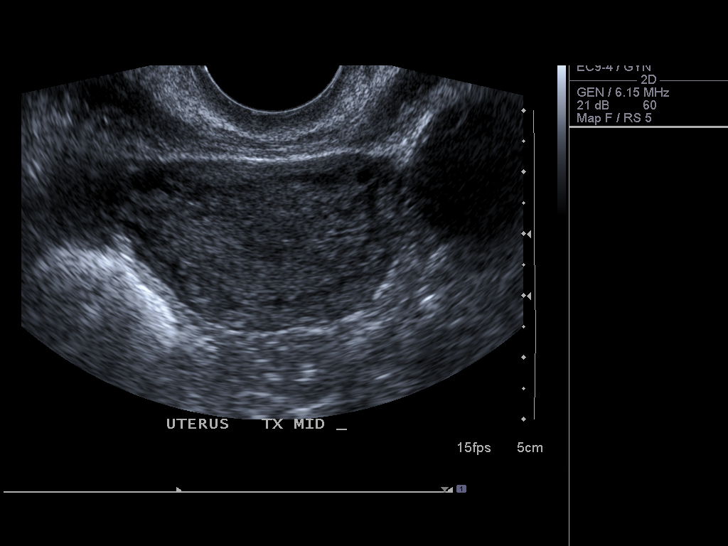
[im 25/50]
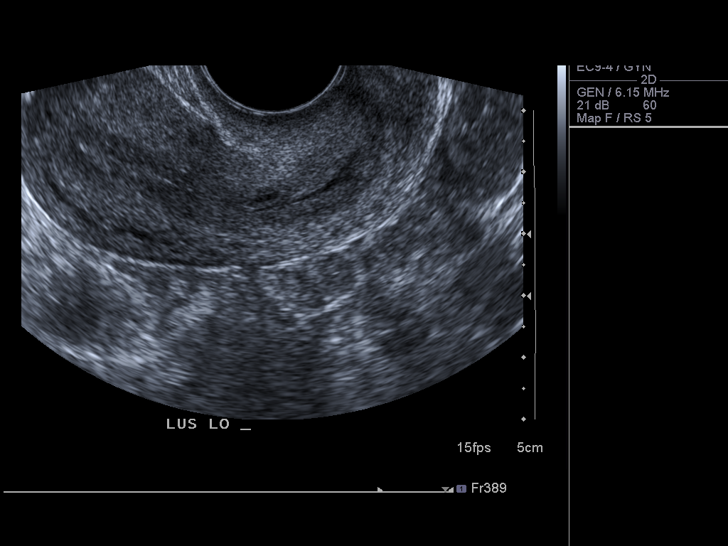
[im 29/50]
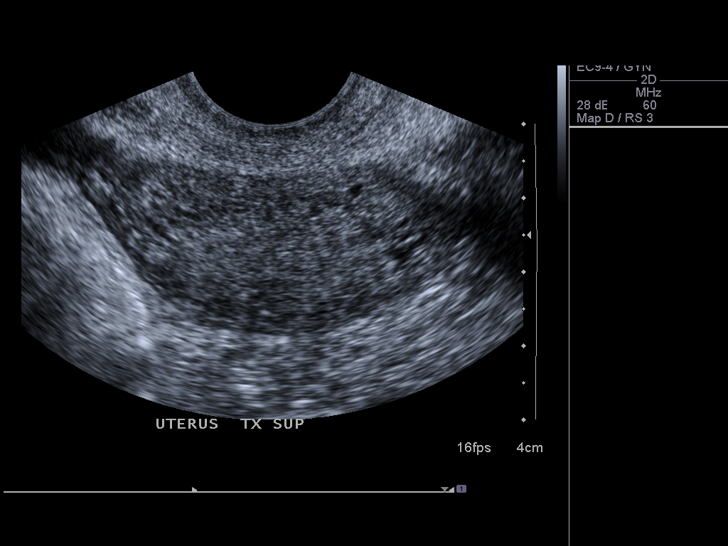
[im 33/50]
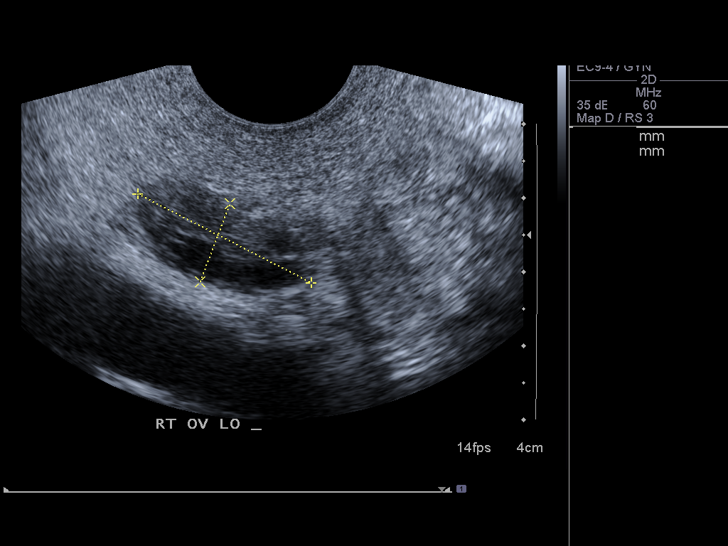
[im 37/50]
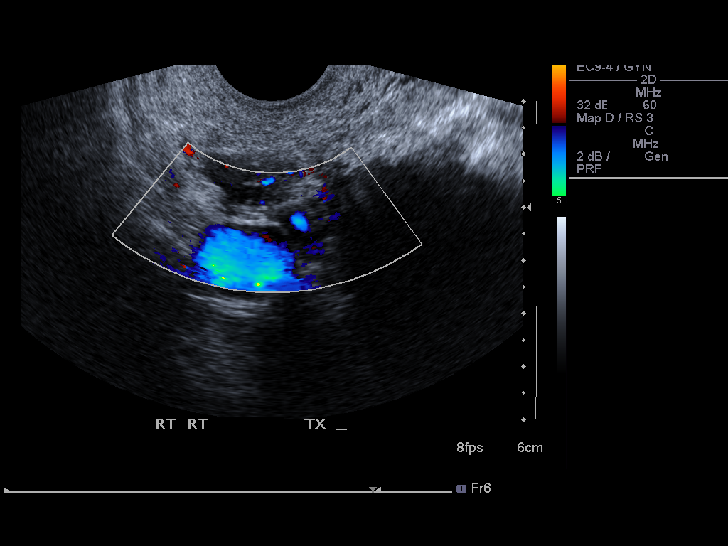
[im 41/50]
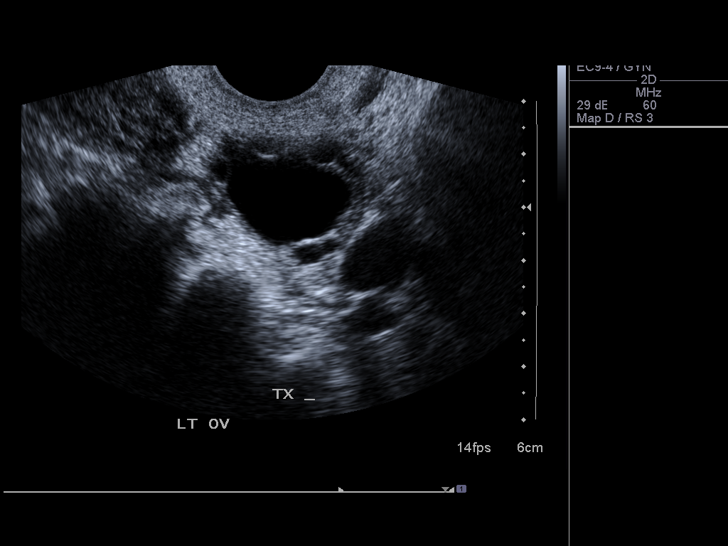
[im 45/50]
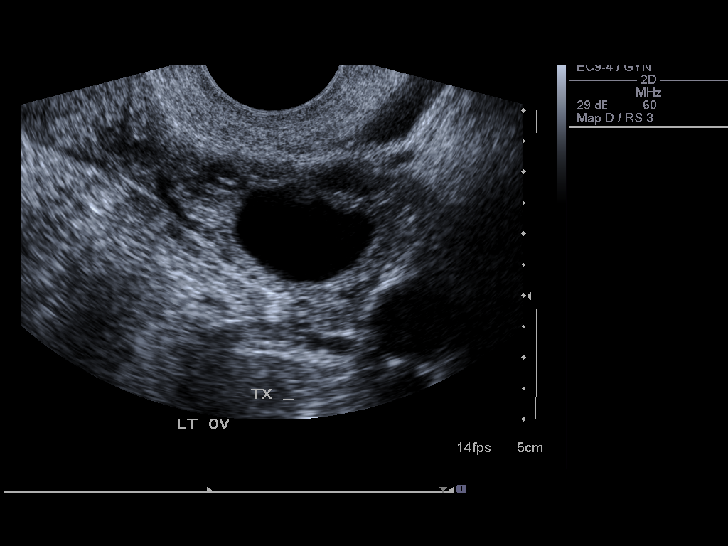
[im 50/50]
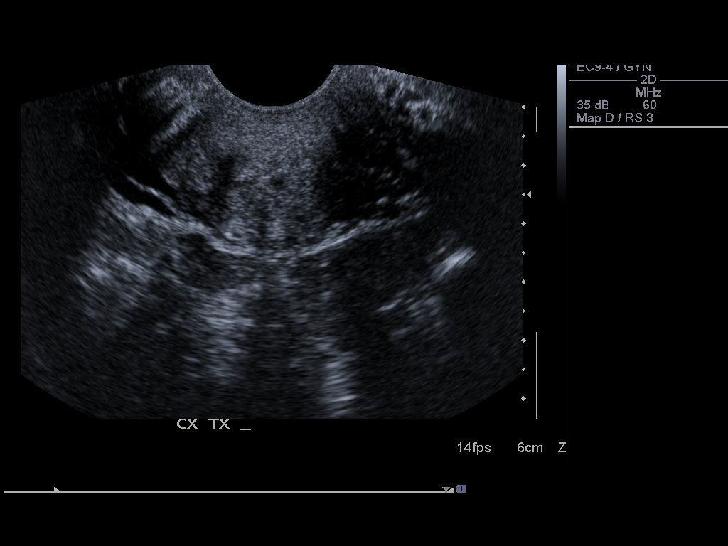

[13 of 25 positions shown; findings below may reference images not displayed]

FINDINGS: Uterus: Is anteverted and anteflexed and demonstrates a sagittal
length of 6.6 cm, depth of 3.1 cm and width of 4.5 cm.  A
homogeneous myometrium is seen

Endometrium: Appears thin and echogenic with a width of 2.5 mm.  No
areas of focal thickening or heterogeneity are noted

Right ovary:  Has a normal appearance measuring 2.6 x 1.1 x 1.8 cm

Left ovary: Has a normal appearance measuring 4.0 x 2.9 x 2.9 cm
and contains a collapsing dominant follicle

Other findings: No pelvic fluid or separate adnexal masses are
seen.
IMPRESSION: Unremarkable pelvic ultrasound.

## 2019-11-29 ENCOUNTER — Emergency Department (HOSPITAL_COMMUNITY): Payer: Managed Care, Other (non HMO)

## 2019-11-29 ENCOUNTER — Other Ambulatory Visit: Payer: Self-pay

## 2019-11-29 ENCOUNTER — Emergency Department (HOSPITAL_COMMUNITY)
Admission: EM | Admit: 2019-11-29 | Discharge: 2019-11-29 | Disposition: A | Discharge status: Home / Self Care | Payer: Managed Care, Other (non HMO) | Attending: Emergency Medicine | Admitting: Emergency Medicine

## 2019-11-29 ENCOUNTER — Encounter (HOSPITAL_COMMUNITY): Payer: Self-pay | Admitting: Emergency Medicine

## 2019-11-29 DIAGNOSIS — F17210 Nicotine dependence, cigarettes, uncomplicated: Secondary | ICD-10-CM | POA: Diagnosis not present

## 2019-11-29 DIAGNOSIS — U071 COVID-19: Secondary | ICD-10-CM

## 2019-11-29 DIAGNOSIS — R0602 Shortness of breath: Secondary | ICD-10-CM | POA: Diagnosis present

## 2019-11-29 DIAGNOSIS — Z79899 Other long term (current) drug therapy: Secondary | ICD-10-CM | POA: Diagnosis not present

## 2019-11-29 HISTORY — DX: COVID-19: U07.1

## 2019-11-29 LAB — COMPREHENSIVE METABOLIC PANEL
ALT: 19 U/L (ref 0–44)
AST: 28 U/L (ref 15–41)
Albumin: 3.4 g/dL — ABNORMAL LOW (ref 3.5–5.0)
Alkaline Phosphatase: 37 U/L — ABNORMAL LOW (ref 38–126)
Anion gap: 9 (ref 5–15)
BUN: 5 mg/dL — ABNORMAL LOW (ref 6–20)
CO2: 27 mmol/L (ref 22–32)
Calcium: 9.5 mg/dL (ref 8.9–10.3)
Chloride: 102 mmol/L (ref 98–111)
Creatinine, Ser: 0.68 mg/dL (ref 0.44–1.00)
GFR calc Af Amer: >60 mL/min (ref >60)
GFR calc non Af Amer: >60 mL/min (ref >60)
Glucose, Bld: 93 mg/dL (ref 70–99)
Potassium: 4.2 mmol/L (ref 3.5–5.1)
Sodium: 138 mmol/L (ref 135–145)
Total Bilirubin: 0.6 mg/dL (ref 0.3–1.2)
Total Protein: 6.5 g/dL (ref 6.5–8.1)

## 2019-11-29 LAB — CBC
HCT: 41.9 % (ref 36.0–46.0)
Hemoglobin: 13.6 g/dL (ref 12.0–15.0)
MCH: 29.2 pg (ref 26.0–34.0)
MCHC: 32.5 g/dL (ref 30.0–36.0)
MCV: 90.1 fL (ref 80.0–100.0)
Platelets: 135 10*3/uL — ABNORMAL LOW (ref 150–400)
RBC: 4.65 MIL/uL (ref 3.87–5.11)
RDW: 12.7 % (ref 11.5–15.5)
WBC: 4.5 10*3/uL (ref 4.0–10.5)
nRBC: 0 % (ref 0.0–0.2)

## 2019-11-29 LAB — URINALYSIS, ROUTINE W REFLEX MICROSCOPIC
Bilirubin Urine: NEGATIVE
Glucose, UA: NEGATIVE mg/dL
Hgb urine dipstick: NEGATIVE
Ketones, ur: 5 mg/dL — AB
Leukocytes,Ua: NEGATIVE
Nitrite: NEGATIVE
Protein, ur: 100 mg/dL — AB
Specific Gravity, Urine: 1.035 — ABNORMAL HIGH (ref 1.005–1.030)
pH: 6 (ref 5.0–8.0)

## 2019-11-29 LAB — I-STAT BETA HCG BLOOD, ED (MC, WL, AP ONLY): I-stat hCG, quantitative: <5 m[IU]/mL (ref <5)

## 2019-11-29 LAB — LIPASE, BLOOD: Lipase: 41 U/L (ref 11–51)

## 2019-11-29 MED ORDER — ONDANSETRON HCL 4 MG/2ML IJ SOLN
4.0000 mg | Freq: Once | INTRAMUSCULAR | Status: AC
  Administered 2019-11-29: 4 mg via INTRAVENOUS
  Filled 2019-11-29: qty 2

## 2019-11-29 MED ORDER — SODIUM CHLORIDE 0.9 % IV SOLN
1200.0000 mg | Freq: Once | INTRAVENOUS | Status: AC
  Administered 2019-11-29: 1200 mg via INTRAVENOUS
  Filled 2019-11-29: qty 10

## 2019-11-29 MED ORDER — METHYLPREDNISOLONE SODIUM SUCC 125 MG IJ SOLR: 125.0000 mg | Freq: Once | INTRAMUSCULAR | Status: DC | PRN

## 2019-11-29 MED ORDER — DIPHENHYDRAMINE HCL 50 MG/ML IJ SOLN: 50.0000 mg | Freq: Once | INTRAMUSCULAR | Status: DC | PRN

## 2019-11-29 MED ORDER — EPINEPHRINE 0.3 MG/0.3ML IJ SOAJ: 0.3000 mg | Freq: Once | INTRAMUSCULAR | Status: DC | PRN

## 2019-11-29 MED ORDER — KETOROLAC TROMETHAMINE 15 MG/ML IJ SOLN
15.0000 mg | Freq: Once | INTRAMUSCULAR | Status: AC
  Administered 2019-11-29: 15 mg via INTRAVENOUS
  Filled 2019-11-29: qty 1

## 2019-11-29 MED ORDER — CYCLOBENZAPRINE HCL 10 MG PO TABS: 10.0000 mg | ORAL_TABLET | Freq: Two times a day (BID) | ORAL | 0 refills | Status: AC | PRN

## 2019-11-29 MED ORDER — CYCLOBENZAPRINE HCL 10 MG PO TABS
5.0000 mg | ORAL_TABLET | Freq: Once | ORAL | Status: AC
  Administered 2019-11-29: 5 mg via ORAL
  Filled 2019-11-29: qty 1

## 2019-11-29 MED ORDER — BENZONATATE 100 MG PO CAPS: 100.0000 mg | ORAL_CAPSULE | Freq: Once | ORAL | Status: DC

## 2019-11-29 MED ORDER — ONDANSETRON 4 MG PO TBDP: 4.0000 mg | ORAL_TABLET | Freq: Three times a day (TID) | ORAL | 0 refills | Status: AC | PRN

## 2019-11-29 MED ORDER — SODIUM CHLORIDE 0.9 % IV BOLUS
1000.0000 mL | Freq: Once | INTRAVENOUS | Status: AC
  Administered 2019-11-29: 1000 mL via INTRAVENOUS

## 2019-11-29 MED ORDER — FAMOTIDINE IN NACL 20-0.9 MG/50ML-% IV SOLN: 20.0000 mg | Freq: Once | INTRAVENOUS | Status: DC | PRN

## 2019-11-29 MED ORDER — SODIUM CHLORIDE 0.9 % IV SOLN: INTRAVENOUS | Status: DC | PRN

## 2019-11-29 MED ORDER — ALBUTEROL SULFATE HFA 108 (90 BASE) MCG/ACT IN AERS: 2.0000 | INHALATION_SPRAY | Freq: Once | RESPIRATORY_TRACT | Status: DC | PRN

## 2019-11-29 NOTE — ED Notes (Signed)
Pt would like to also be checked out with her on-going sinus infection and if it is getting better

## 2019-11-29 NOTE — ED Triage Notes (Signed)
Pt states she tested + for COVID on Tuesday.  Reports cough that was productive but now non-productive, headache, SOB, abd pain, and diarrhea.  States she is also taking an antibiotic for a sinus infection.

## 2019-11-29 NOTE — ED Provider Notes (Signed)
I saw and evaluated the patient, reviewed the resident's note and I agree with the findings and plan.  EKG:   39 year old female diagnosed with Covid recently.  Has had symptoms for past week.  Patient described here for monoclonal antibody and will be discharged   Lorre Nick, MD 11/29/19 [phone]

## 2019-11-29 NOTE — ED Provider Notes (Signed)
[facility] EMERGENCY DEPARTMENT Provider Note   CSN: [id number] Arrival date & time: 11/29/19  1219     History Chief Complaint  Patient presents with  . Covid Positive    Joanne Brown is a 39 y.o. female past medical history of depression, colitis presented to the ED today for nausea, fatigue, cough, shortness of breath in the setting of known COVID-19.  She states symptoms began 7 days ago and have gradually worsened.  Denies chest pain, does have occasional chills, no more vomiting, no hemoptysis or leg swelling.  The history is provided by the patient.  Illness Quality:  Shortness of breath, cough, fatigue Severity:  Moderate Onset quality:  Gradual Duration:  7 days Timing:  Constant Progression:  Worsening Chronicity:  New Context:  Known COVID-19 Associated symptoms: cough, nausea and shortness of breath   Associated symptoms: no abdominal pain, no chest pain, no fever, no headaches, no rash and no vomiting        Past Medical History:  Diagnosis Date  . Allergy   . Anxiety   . Colitis   . COVID-19   . Depression   . Diverticulitis   . Smoking addiction     Patient Active Problem List   Diagnosis Date Noted  . Depression   . Anxiety   . Diverticulitis   . Smoking addiction   . Allergy     Past Surgical History:  Procedure Laterality Date  . TONSILLECTOMY    . TONSILLECTOMY       OB History   No obstetric history on file.     Family History  Problem Relation Age of Onset  . Diabetes Father   . Heart disease Father   . Heart disease Brother     Social History   Tobacco Use  . Smoking status: Current Every Day Smoker    Years: 15.00    Types: Cigarettes  . Smokeless tobacco: Never Used  Substance Use Topics  . Alcohol use: No  . Drug use: No    Home Medications Prior to Admission medications   Medication Sig Start Date End Date Taking? Authorizing Provider  amoxicillin-clavulanate (AUGMENTIN) 875-125  MG tablet Take 1 tablet by mouth 2 (two) times daily. 11/27/19  Yes [provider]  benzonatate (TESSALON) 200 MG capsule Take 400 mg by mouth every 8 (eight) hours as needed for cough.  11/26/19  Yes [provider]  clonazePAM (KLONOPIN) 0.5 MG tablet Take 0.5 mg by mouth daily as needed for anxiety.  11/02/19  Yes [provider]  famotidine (PEPCID) 40 MG tablet Take 40 mg by mouth daily. 06/10/19  Yes [provider]  hyoscyamine (LEVSIN SL) 0.125 MG SL tablet Take 0.125 mg by mouth every 6 (six) hours as needed for cramping.  06/10/19  Yes [provider]  medroxyPROGESTERone (DEPO-PROVERA) 150 MG/ML injection Inject 150 mg into the muscle every 3 (three) months.  11/26/19  Yes [provider]  mesalamine (LIALDA) 1.2 g EC tablet Take 2.4 g by mouth in the morning and at bedtime.  10/29/19  Yes [provider]  oxybutynin (DITROPAN-XL) 10 MG 24 hr tablet Take 10 mg by mouth daily. 09/21/19  Yes [provider]  sertraline (ZOLOFT) 50 MG tablet Take 50 mg by mouth daily. 11/04/19  Yes [provider]  valACYclovir (VALTREX) 1000 MG tablet Take 1,000 mg by mouth daily. 10/08/19  Yes [provider]  ciprofloxacin (CIPRO) 500 MG tablet Take 1 tablet (500  mg total) by mouth 2 (two) times daily. Patient not taking: Reported on 11/29/2019 02/02/14   Le, Thao P, DO  cyclobenzaprine (FLEXERIL) 10 MG tablet Take 1 tablet (10 mg total) by mouth 2 (two) times daily as needed for muscle spasms. 11/29/19   Loree Feeedding, Maeci Kalbfleisch, MD  metroNIDAZOLE (FLAGYL) 500 MG tablet Take 1 tablet (500 mg total) by mouth 2 (two) times daily. Patient not taking: Reported on 11/29/2019 02/02/14   Le, Thao P, DO  ondansetron (ZOFRAN ODT) 4 MG disintegrating tablet Take 1 tablet (4 mg total) by mouth every 8 (eight) hours as needed for nausea or vomiting. 11/29/19   Loree Feeedding, Shelby Anderle, MD    Allergies    Patient has no known allergies.  Review of Systems     Review of Systems  Constitutional: Positive for chills. Negative for fever.  HENT: Negative for facial swelling and voice change.   Eyes: Negative for redness and visual disturbance.  Respiratory: Positive for cough and shortness of breath.   Cardiovascular: Negative for chest pain and palpitations.  Gastrointestinal: Positive for nausea. Negative for abdominal pain and vomiting.  Genitourinary: Negative for difficulty urinating and dysuria.  Musculoskeletal: Negative for gait problem and joint swelling.  Skin: Negative for rash and wound.  Neurological: Negative for dizziness and headaches.  Psychiatric/Behavioral: Negative for confusion and suicidal ideas.    Physical Exam Updated Vital Signs BP 106/68 (BP Location: Right Arm)   Pulse 79   Temp 98.8 F (37.1 C) (Oral)   Resp 16   Ht 5\' 3"  (1.6 m)   Wt 68 kg   SpO2 96%   BMI 26.57 kg/m   Physical Exam Constitutional:      General: She is not in acute distress. HENT:     Head: Normocephalic and atraumatic.     Mouth/Throat:     Mouth: Mucous membranes are moist.     Pharynx: Oropharynx is clear.  Eyes:     General: No scleral icterus.    Pupils: Pupils are equal, round, and reactive to light.  Cardiovascular:     Rate and Rhythm: Normal rate and regular rhythm.     Pulses: Normal pulses.  Pulmonary:     Effort: Pulmonary effort is normal. No respiratory distress.  Abdominal:     General: There is no distension.     Tenderness: There is no abdominal tenderness.  Musculoskeletal:        General: No tenderness or deformity.     Cervical back: Normal range of motion and neck supple.  Neurological:     General: No focal deficit present.     Mental Status: She is alert and oriented to person, place, and time.  Psychiatric:        Mood and Affect: Mood normal.        Behavior: Behavior normal.     ED Results / Procedures / Treatments   Labs (all labs ordered are listed, but only abnormal results are  displayed) Labs Reviewed  COMPREHENSIVE METABOLIC PANEL - Abnormal; Notable for the following components:      Result Value   BUN 5 (*)    Albumin 3.4 (*)    Alkaline Phosphatase 37 (*)    All other components within normal limits  CBC - Abnormal; Notable for the following components:   Platelets 135 (*)    All other components within normal limits  LIPASE, BLOOD  URINALYSIS, ROUTINE W REFLEX MICROSCOPIC  I-STAT BETA HCG BLOOD, ED ([facility], [facility], [facility] ONLY)  EKG None  Radiology DG Chest Portable 1 View  Result Date: 11/29/2019 CLINICAL DATA:  Shortness of breath. COVID-19 infection. EXAM: PORTABLE CHEST 1 VIEW COMPARISON:  November 26, 2019 FINDINGS: Cardiomediastinal silhouette is normal. Mediastinal contours appear intact. Mild patchy airspace consolidation in bilateral lower lobes, consistent with viral/atypical pneumonia. Osseous structures are without acute abnormality. Soft tissues are grossly normal. IMPRESSION: Mild patchy airspace consolidation in bilateral lower lobes, consistent with viral/atypical pneumonia. Electronically Signed   By: Ted Mcalpine M.D.   On: 11/29/2019 17:02    Procedures Procedures (including critical care time)  Medications Ordered in ED Medications  0.9 %  sodium chloride infusion (has no administration in time range)  diphenhydrAMINE (BENADRYL) injection 50 mg (has no administration in time range)  famotidine (PEPCID) IVPB 20 mg premix (has no administration in time range)  methylPREDNISolone sodium succinate (SOLU-MEDROL) 125 mg/2 mL injection 125 mg (has no administration in time range)  albuterol (VENTOLIN HFA) 108 (90 Base) MCG/ACT inhaler 2 puff (has no administration in time range)  EPINEPHrine (EPI-PEN) injection 0.3 mg (has no administration in time range)  benzonatate (TESSALON) capsule 100 mg (has no administration in time range)  casirivimab-imdevimab (REGEN-COV) 1,200 mg in sodium chloride 0.9 % 110 mL IVPB (0 mg Intravenous Stopped  11/29/19 1736)  sodium chloride 0.9 % bolus 1,000 mL (1,000 mLs Intravenous New Bag/Given 11/29/19 1610)  ketorolac (TORADOL) 15 MG/ML injection 15 mg (15 mg Intravenous Given 11/29/19 1608)  ondansetron (ZOFRAN) injection 4 mg (4 mg Intravenous Given 11/29/19 1607)  cyclobenzaprine (FLEXERIL) tablet 5 mg (5 mg Oral Given 11/29/19 1607)    ED Course  I have reviewed the triage vital signs and the nursing notes.  Pertinent labs & imaging results that were available during my care of the patient were reviewed by me and considered in my medical decision making (see chart for details).    MDM Rules/Calculators/A&P                          Differential diagnosis considered: COVID-19, hypoxic respiratory failure, PE, bacterial pneumonia  Patient presents to ED with fatigue, cough, shortness of breath in the setting of known COVID-19.  She has no risk factors nor concerns for PE and using PERC criteria, PE can effectively be excluded.  She does not desaturate below 96% when ambulated throughout the room and would not require admission for oxygen would not benefit from steroids or remdesivir.  Patient does technically qualify for monoclonal antibody fusion based on her BMI of 26.  Will administer monoclonal antibodies, Toradol, fluids, Zofran, Flexeril for symptomatic management.  Chest x-ray significant for: No consolidations or clinically significant finding  On reassessment after monoclonal antibody infusion, patient complaining of chills the remains hemodynamically stable and counseled on strict return precautions.  At this time I feel she is stable for discharge home and follow-up with PCP in few days if symptoms not improving.   Final Clinical Impression(s) / ED Diagnoses Final diagnoses:  COVID-19    Rx / DC Orders ED Discharge Orders         Ordered    ondansetron (ZOFRAN ODT) 4 MG disintegrating tablet  Every 8 hours PRN        11/29/19 1939    cyclobenzaprine (FLEXERIL) 10 MG tablet   2 times daily PRN        11/29/19 1939         Labs, studies and imaging reviewed by myself and considered  in medical decision making if ordered. Imaging interpreted by radiology. Pt was discussed with my attending, Dr. Freida Busman  Electronically signed by:  Christiane Ha Redding9/5/20217:41 PM       Loree Fee, MD 11/29/19 1941    Lorre Nick, MD 12/01/19 1100

## 2019-11-29 NOTE — ED Notes (Signed)
Consent at bedside. Pt was given medication information and possible side effects. Time allowed for questions.

## 2019-11-29 NOTE — ED Notes (Signed)
Pt given work note

## 2022-03-11 IMAGING — DX DG CHEST 1V PORT
1 series · 1 of 1 positions shown · non-contrast
Comparison: November 26, 2019

CLINICAL DATA: Shortness of breath.

[id number] infection.
EXAM:
PORTABLE CHEST 1 VIEW

[chest]
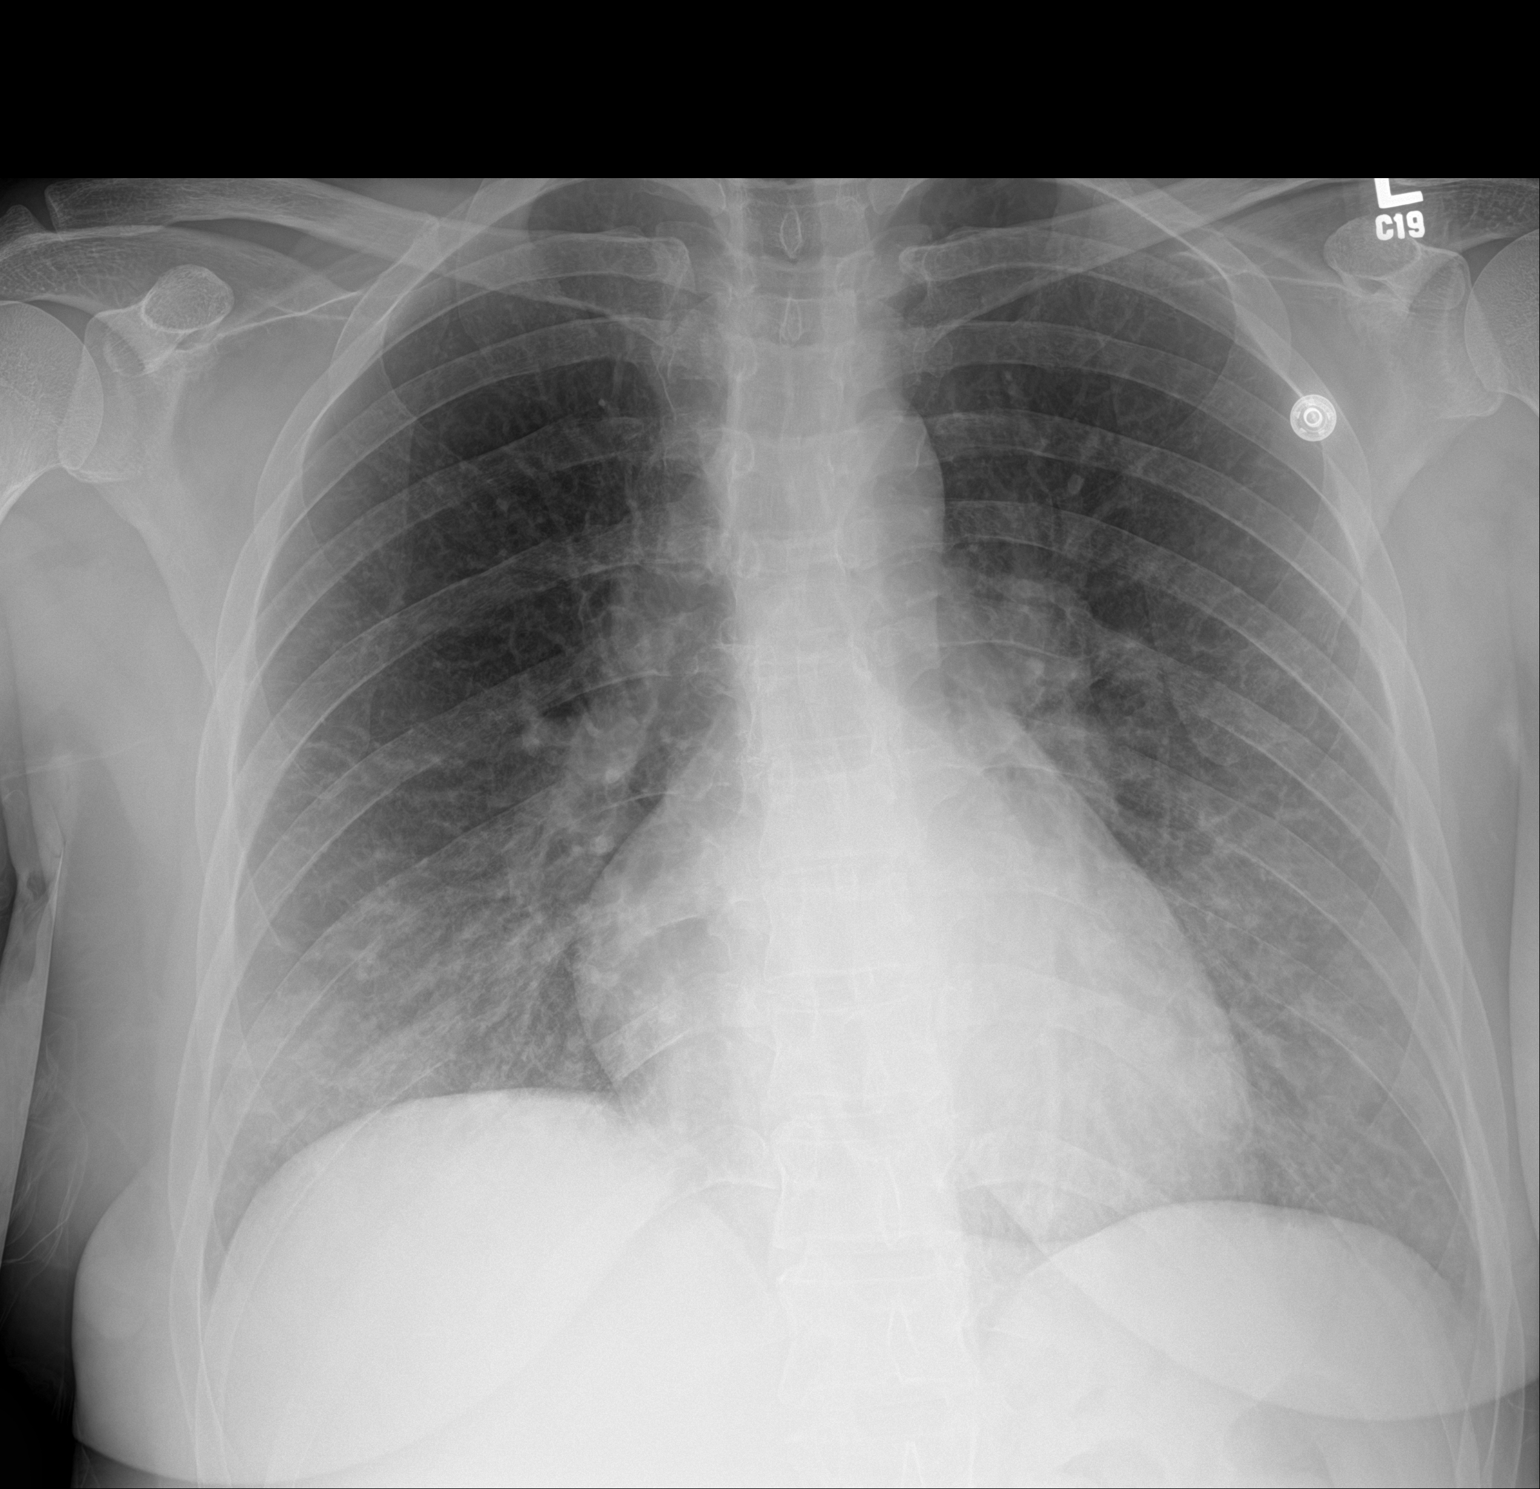

[1 of 1 positions shown; findings below may reference images not displayed]

FINDINGS: Cardiomediastinal silhouette is normal. Mediastinal contours appear
intact.

Mild patchy airspace consolidation in bilateral lower lobes,
consistent with viral/atypical pneumonia.

Osseous structures are without acute abnormality. Soft tissues are
grossly normal.
IMPRESSION: Mild patchy airspace consolidation in bilateral lower lobes,
consistent with viral/atypical pneumonia.
